# Patient Record
Sex: Female | Born: 1975
Health system: Southern US, Community
[De-identification: ages and names within clinical notes are randomized; demographics above are authoritative.]

## PROBLEM LIST (undated history)

## (undated) DIAGNOSIS — M722 Plantar fascial fibromatosis: Secondary | ICD-10-CM

## (undated) DIAGNOSIS — F418 Other specified anxiety disorders: Secondary | ICD-10-CM

## (undated) DIAGNOSIS — K219 Gastro-esophageal reflux disease without esophagitis: Secondary | ICD-10-CM

## (undated) DIAGNOSIS — M779 Enthesopathy, unspecified: Secondary | ICD-10-CM

## (undated) DIAGNOSIS — G43909 Migraine, unspecified, not intractable, without status migrainosus: Secondary | ICD-10-CM

## (undated) HISTORY — DX: Migraine, unspecified, not intractable, without status migrainosus: G43.909

## (undated) HISTORY — DX: Enthesopathy, unspecified: M77.9

## (undated) HISTORY — PX: CHOLECYSTECTOMY: SHX55

## (undated) HISTORY — DX: Other specified anxiety disorders: F41.8

## (undated) HISTORY — PX: INCISION AND DRAINAGE PERIRECTAL ABSCESS: SHX1804

## (undated) HISTORY — DX: Plantar fascial fibromatosis: M72.2

## (undated) HISTORY — PX: TUBAL LIGATION: SHX77

## (undated) HISTORY — PX: LEG SURGERY: SHX1003

---

## 2007-01-01 ENCOUNTER — Other Ambulatory Visit: Admission: RE | Admit: 2007-01-01 | Discharge: 2007-01-01 | Payer: Self-pay | Admitting: Internal Medicine

## 2013-05-19 ENCOUNTER — Telehealth: Payer: Self-pay | Admitting: Family Medicine

## 2013-05-19 NOTE — Telephone Encounter (Signed)
Wanted appt for today for leg pain. No appt available. Will be a new pt. Will to to urgent care today.

## 2013-09-25 ENCOUNTER — Other Ambulatory Visit (HOSPITAL_COMMUNITY): Payer: Self-pay | Admitting: Family Medicine

## 2013-09-25 DIAGNOSIS — R29898 Other symptoms and signs involving the musculoskeletal system: Secondary | ICD-10-CM

## 2013-09-29 ENCOUNTER — Ambulatory Visit (HOSPITAL_COMMUNITY)
Admission: RE | Admit: 2013-09-29 | Discharge: 2013-09-29 | Disposition: A | Discharge status: Home / Self Care | Payer: BC Managed Care – PPO | Source: Ambulatory Visit | Attending: Family Medicine | Admitting: Family Medicine

## 2013-09-29 DIAGNOSIS — R29898 Other symptoms and signs involving the musculoskeletal system: Secondary | ICD-10-CM

## 2013-10-02 ENCOUNTER — Other Ambulatory Visit (HOSPITAL_COMMUNITY): Payer: Self-pay | Admitting: Family Medicine

## 2013-10-02 DIAGNOSIS — R229 Localized swelling, mass and lump, unspecified: Secondary | ICD-10-CM

## 2013-10-06 ENCOUNTER — Other Ambulatory Visit (HOSPITAL_COMMUNITY): Payer: Self-pay | Admitting: Family Medicine

## 2013-10-06 ENCOUNTER — Ambulatory Visit (HOSPITAL_COMMUNITY)
Admission: RE | Admit: 2013-10-06 | Discharge: 2013-10-06 | Disposition: A | Discharge status: Home / Self Care | Payer: BC Managed Care – PPO | Source: Ambulatory Visit | Attending: Family Medicine | Admitting: Family Medicine

## 2013-10-06 DIAGNOSIS — M949 Disorder of cartilage, unspecified: Principal | ICD-10-CM

## 2013-10-06 DIAGNOSIS — R229 Localized swelling, mass and lump, unspecified: Secondary | ICD-10-CM

## 2013-10-06 DIAGNOSIS — M899 Disorder of bone, unspecified: Secondary | ICD-10-CM | POA: Insufficient documentation

## 2013-10-10 ENCOUNTER — Other Ambulatory Visit (HOSPITAL_COMMUNITY): Payer: Self-pay | Admitting: Family Medicine

## 2013-10-10 DIAGNOSIS — R229 Localized swelling, mass and lump, unspecified: Secondary | ICD-10-CM

## 2013-10-14 ENCOUNTER — Ambulatory Visit (HOSPITAL_COMMUNITY)
Admission: RE | Admit: 2013-10-14 | Discharge: 2013-10-14 | Disposition: A | Discharge status: Home / Self Care | Payer: BC Managed Care – PPO | Source: Ambulatory Visit | Attending: Family Medicine | Admitting: Family Medicine

## 2013-10-14 DIAGNOSIS — R229 Localized swelling, mass and lump, unspecified: Secondary | ICD-10-CM

## 2013-10-14 DIAGNOSIS — D2120 Benign neoplasm of connective and other soft tissue of unspecified lower limb, including hip: Secondary | ICD-10-CM | POA: Diagnosis not present

## 2017-07-27 ENCOUNTER — Ambulatory Visit (HOSPITAL_COMMUNITY)
Admission: RE | Admit: 2017-07-27 | Discharge: 2017-07-27 | Disposition: A | Discharge status: Home / Self Care | Payer: 59 | Source: Ambulatory Visit | Attending: Family Medicine | Admitting: Family Medicine

## 2017-07-27 ENCOUNTER — Other Ambulatory Visit (HOSPITAL_COMMUNITY): Payer: Self-pay | Admitting: Family Medicine

## 2017-07-27 DIAGNOSIS — M79674 Pain in right toe(s): Secondary | ICD-10-CM

## 2017-07-27 DIAGNOSIS — E669 Obesity, unspecified: Secondary | ICD-10-CM | POA: Insufficient documentation

## 2017-07-27 DIAGNOSIS — M19071 Primary osteoarthritis, right ankle and foot: Secondary | ICD-10-CM | POA: Diagnosis not present

## 2017-07-27 DIAGNOSIS — M19072 Primary osteoarthritis, left ankle and foot: Secondary | ICD-10-CM | POA: Diagnosis not present

## 2017-07-27 DIAGNOSIS — M79675 Pain in left toe(s): Secondary | ICD-10-CM

## 2017-07-27 DIAGNOSIS — M21612 Bunion of left foot: Secondary | ICD-10-CM | POA: Insufficient documentation

## 2018-05-13 DIAGNOSIS — J019 Acute sinusitis, unspecified: Secondary | ICD-10-CM | POA: Diagnosis not present

## 2018-05-13 DIAGNOSIS — H6993 Unspecified Eustachian tube disorder, bilateral: Secondary | ICD-10-CM | POA: Diagnosis not present

## 2018-05-13 DIAGNOSIS — Z1389 Encounter for screening for other disorder: Secondary | ICD-10-CM | POA: Diagnosis not present

## 2018-05-13 DIAGNOSIS — Z6831 Body mass index (BMI) 31.0-31.9, adult: Secondary | ICD-10-CM | POA: Diagnosis not present

## 2018-05-13 DIAGNOSIS — E6609 Other obesity due to excess calories: Secondary | ICD-10-CM | POA: Diagnosis not present

## 2018-05-23 DIAGNOSIS — F419 Anxiety disorder, unspecified: Secondary | ICD-10-CM | POA: Diagnosis not present

## 2018-11-28 ENCOUNTER — Other Ambulatory Visit: Payer: Self-pay

## 2018-11-28 DIAGNOSIS — Z20828 Contact with and (suspected) exposure to other viral communicable diseases: Secondary | ICD-10-CM | POA: Diagnosis not present

## 2018-11-28 DIAGNOSIS — Z20822 Contact with and (suspected) exposure to covid-19: Secondary | ICD-10-CM

## 2018-11-30 LAB — NOVEL CORONAVIRUS, NAA: SARS-CoV-2, NAA: NOT DETECTED

## 2019-01-10 DIAGNOSIS — K219 Gastro-esophageal reflux disease without esophagitis: Secondary | ICD-10-CM | POA: Diagnosis not present

## 2019-01-10 DIAGNOSIS — E6609 Other obesity due to excess calories: Secondary | ICD-10-CM | POA: Diagnosis not present

## 2019-01-10 DIAGNOSIS — E7849 Other hyperlipidemia: Secondary | ICD-10-CM | POA: Diagnosis not present

## 2019-01-10 DIAGNOSIS — Z6830 Body mass index (BMI) 30.0-30.9, adult: Secondary | ICD-10-CM | POA: Diagnosis not present

## 2019-01-10 DIAGNOSIS — R39198 Other difficulties with micturition: Secondary | ICD-10-CM | POA: Diagnosis not present

## 2019-01-10 DIAGNOSIS — R1013 Epigastric pain: Secondary | ICD-10-CM | POA: Diagnosis not present

## 2019-01-10 DIAGNOSIS — M549 Dorsalgia, unspecified: Secondary | ICD-10-CM | POA: Diagnosis not present

## 2019-01-13 DIAGNOSIS — R1013 Epigastric pain: Secondary | ICD-10-CM | POA: Diagnosis not present

## 2019-01-24 DIAGNOSIS — R109 Unspecified abdominal pain: Secondary | ICD-10-CM | POA: Diagnosis not present

## 2019-01-29 ENCOUNTER — Encounter: Payer: Self-pay | Admitting: Gastroenterology

## 2019-02-25 ENCOUNTER — Encounter: Payer: Self-pay | Admitting: Gastroenterology

## 2019-02-25 ENCOUNTER — Other Ambulatory Visit: Payer: Self-pay

## 2019-02-25 ENCOUNTER — Ambulatory Visit: Payer: BC Managed Care – PPO | Admitting: Gastroenterology

## 2019-02-25 DIAGNOSIS — K760 Fatty (change of) liver, not elsewhere classified: Secondary | ICD-10-CM

## 2019-02-25 DIAGNOSIS — R1013 Epigastric pain: Secondary | ICD-10-CM

## 2019-02-25 NOTE — Patient Instructions (Addendum)
We have scheduled an upper endoscopy in the near future.  Continue with Protonix once each morning, 30 minutes before breakfast on an empty stomach as it is best absorbed this way.   I have attached a brief handout on fatty liver. Recommend yearly checks of liver numbers. Focus on good dietary habits and exercise as you have started to do!  It was a pleasure to see you today. I want to create trusting relationships with patients to provide genuine, compassionate, and quality care. I value your feedback. If you receive a survey regarding your visit,  I greatly appreciate you taking time to fill this out.   Annitta Needs, PhD, ANP-BC [facility]    Fatty Liver Disease  Fatty liver disease occurs when too much fat has built up in your liver cells. Fatty liver disease is also called hepatic steatosis or steatohepatitis. The liver removes harmful substances from your bloodstream and produces fluids that your body needs. It also helps your body use and store energy from the food you eat. In many cases, fatty liver disease does not cause symptoms or problems. It is often diagnosed when tests are being done for other reasons. However, over time, fatty liver can cause inflammation that may lead to more serious liver problems, such as scarring of the liver (cirrhosis) and liver failure. Fatty liver is associated with insulin resistance, increased body fat, high blood pressure (hypertension), and high cholesterol. These are features of metabolic syndrome and increase your risk for stroke, diabetes, and heart disease. What are the causes? This condition may be caused by:  Drinking too much alcohol.  Poor nutrition.  Obesity.  Cushing's syndrome.  Diabetes.  High cholesterol.  Certain drugs.  Poisons.  Some viral infections.  Pregnancy. What increases the risk? You are more likely to develop this condition if you:  Abuse alcohol.  Are overweight.  Have  diabetes.  Have hepatitis.  Have a high triglyceride level.  Are pregnant. What are the signs or symptoms? Fatty liver disease often does not cause symptoms. If symptoms do develop, they can include:  Fatigue.  Weakness.  Weight loss.  Confusion.  Abdominal pain.  Nausea and vomiting.  Yellowing of your skin and the white parts of your eyes (jaundice).  Itchy skin. How is this diagnosed? This condition may be diagnosed by:  A physical exam and medical history.  Blood tests.  Imaging tests, such as an ultrasound, CT scan, or MRI.  A liver biopsy. A small sample of liver tissue is removed using a needle. The sample is then looked at under a microscope. How is this treated? Fatty liver disease is often caused by other health conditions. Treatment for fatty liver may involve medicines and lifestyle changes to manage conditions such as:  Alcoholism.  High cholesterol.  Diabetes.  Being overweight or obese. Follow these instructions at home:   Do not drink alcohol. If you have trouble quitting, ask your health care provider how to safely quit with the help of medicine or a supervised program. This is important to keep your condition from getting worse.  Eat a healthy diet as told by your health care provider. Ask your health care provider about working with a diet and nutrition specialist (dietitian) to develop an eating plan.  Exercise regularly. This can help you lose weight and control your cholesterol and diabetes. Talk to your health care provider about an exercise plan and which activities are best for you.  Take over-the-counter and prescription medicines only  as told by your health care provider.  Keep all follow-up visits as told by your health care provider. This is important. Contact a health care provider if: You have trouble controlling your:  Blood sugar. This is especially important if you have diabetes.  Cholesterol.  Drinking of alcohol. Get  help right away if:  You have abdominal pain.  You have jaundice.  You have nausea and vomiting.  You vomit blood or material that looks like coffee grounds.  You have stools that are black, tar-like, or bloody. Summary  Fatty liver disease develops when too much fat builds up in the cells of your liver.  Fatty liver disease often causes no symptoms or problems. However, over time, fatty liver can cause inflammation that may lead to more serious liver problems, such as scarring of the liver (cirrhosis).  You are more likely to develop this condition if you abuse alcohol, are pregnant, are overweight, have diabetes, have hepatitis, or have high triglyceride levels.  Contact your health care provider if you have trouble controlling your weight, blood sugar, cholesterol, or drinking of alcohol. This information is not intended to replace advice given to you by your health care provider. Make sure you discuss any questions you have with your health care provider. Document Revised: 01/19/2017 Document Reviewed: 11/15/2016 Elsevier Patient Education  2020 [employer].

## 2019-02-25 NOTE — Progress Notes (Addendum)
REVIEWED-NO ADDITIONAL RECOMMENDATIONS.  Primary Care Physician:  Redmond School, MD Primary Gastroenterologist:  Dr. Oneida Alar   Chief Complaint  Patient presents with  . Abdominal Pain    2 months, comes/goes  . Nausea    all the time    HPI:   Veronica Jarvis is a 44 y.o. female presenting today at the request of Dr. Gerarda Fraction due to abdominal pain.   Labs from PCP received dated 01/28/2019: Hgb 13.4, HCt 38.5, Platelets 357, tbili 0.3, Alk Phos 53, AST 18, ALT 17. US abdomen Nov 2020 through [facility] (available through [place]) with fatty liver.    Upper abdominal pain starting in 12-15-2018. Father passed away in 12/15/2018 as well. Associated nausea. Sometimes food will exacerbate pain. Intermittent. Pain will occur several times per day, lasting 30 minutes to an hour at a time. Nothing relieves. Feels like a stabbing pain. No typical reflux symptoms. Had been taking OTC medications then was started on Protonix. Zofran helps relieve nausea.  No solid food dysphagia but feels like she gets strangled with liquids. No weight loss. Appetite is healthy. No lower GI concerns. No melena or hematochezia. Used to take Ibuprofen a lot until the past year and then placed on Meloxicam. Was then taken off meloxicam and placed on Celebrex.   Past Medical History:  Diagnosis Date  . Bone spur   . Depression with anxiety   . Migraines   . Plantar fasciitis     Past Surgical History:  Procedure Laterality Date  . CHOLECYSTECTOMY    . INCISION AND DRAINAGE PERIRECTAL ABSCESS     in 16s  . LEG SURGERY     bone shaved down on femur  . TUBAL LIGATION      Current Outpatient Medications  Medication Sig Dispense Refill  . ALPRAZolam (XANAX) 0.5 MG tablet Take 0.5 mg by mouth 3 (three) times daily.    . Ascorbic Acid (VITA-C PO) Take by mouth daily.    Marland Kitchen buPROPion (WELLBUTRIN XL) 300 MG 24 hr tablet Take 300 mg by mouth daily.    . celecoxib (CELEBREX) 200 MG capsule Take 200 mg by mouth 2 (two)  times daily.    Marland Kitchen CRANBERRY PO Take by mouth daily.    . fexofenadine (ALLEGRA) 180 MG tablet Take 180 mg by mouth daily.    . ondansetron (ZOFRAN) 4 MG tablet Take 4 mg by mouth every 6 (six) hours as needed.    . pantoprazole (PROTONIX) 40 MG tablet Take 40 mg by mouth daily.    . rizatriptan (MAXALT-MLT) 10 MG disintegrating tablet as needed.     No current facility-administered medications for this visit.    Allergies as of 02/25/2019  . (No Known Allergies)    Family History  Problem Relation Age of Onset  . Cirrhosis Mother   . Colon cancer Neg Hx   . Colon polyps Neg Hx     Social History   Socioeconomic History  . Marital status: Married    Spouse name: Not on file  . Number of children: Not on file  . Years of education: Not on file  . Highest education level: Not on file  Occupational History  . Not on file  Tobacco Use  . Smoking status: Current Every Day Smoker    Packs/day: 1.00    Types: Cigarettes  . Smokeless tobacco: Never Used  Substance and Sexual Activity  . Alcohol use: Yes    Comment: occas  . Drug use: Never  .  Sexual activity: Not on file  Other Topics Concern  . Not on file  Social History Narrative  . Not on file   Social Determinants of Health   Financial Resource Strain:   . Difficulty of Paying Living Expenses: Not on file  Food Insecurity:   . Worried About Charity fundraiser in the Last Year: Not on file  . Ran Out of Food in the Last Year: Not on file  Transportation Needs:   . Lack of Transportation (Medical): Not on file  . Lack of Transportation (Non-Medical): Not on file  Physical Activity:   . Days of Exercise per Week: Not on file  . Minutes of Exercise per Session: Not on file  Stress:   . Feeling of Stress : Not on file  Social Connections:   . Frequency of Communication with Friends and Family: Not on file  . Frequency of Social Gatherings with Friends and Family: Not on file  . Attends Religious Services: Not  on file  . Active Member of Clubs or Organizations: Not on file  . Attends Archivist Meetings: Not on file  . Marital Status: Not on file  Intimate Partner Violence:   . Fear of Current or Ex-Partner: Not on file  . Emotionally Abused: Not on file  . Physically Abused: Not on file  . Sexually Abused: Not on file    Review of Systems: Gen: Denies any fever, chills, fatigue, weight loss, lack of appetite.  CV: Denies chest pain, heart palpitations, peripheral edema, syncope.  Resp: Denies shortness of breath at rest or with exertion. Denies wheezing or cough.  GI: see HPI GU : Denies urinary burning, urinary frequency, urinary hesitancy MS: Denies joint pain, muscle weakness, cramps, or limitation of movement.  Derm: Denies rash, itching, dry skin Psych: Denies depression, anxiety, memory loss, and confusion Heme: Denies bruising, bleeding, and enlarged lymph nodes.  Physical Exam: BP 128/79   Pulse 77   Temp (!) 97.1 F (36.2 C) (Oral)   Ht '5\' 9"'$  (1.753 m)   Wt 183 lb 3.2 oz (83.1 kg)   BMI 27.05 kg/m  General:   Alert and oriented. Pleasant and cooperative. Well-nourished and well-developed.  Head:  Normocephalic and atraumatic. Eyes:  Without icterus, sclera clear and conjunctiva pink.  Lungs:  Clear to auscultation bilaterally. No wheezes, rales, or rhonchi. No distress.  Heart:  S1, S2 present without murmurs appreciated.  Abdomen:  +BS, soft, non-tender and non-distended. No HSM noted. No guarding or rebound. No masses appreciated.  Rectal:  Deferred  Msk:  Symmetrical without gross deformities. Normal posture. Extremities:  Without edema. Neurologic:  Alert and  oriented x4;  grossly normal neurologically. Skin:  Intact without significant lesions or rashes. Psych:  Alert and cooperative. Normal mood and affect.  ASSESSMENT: Veronica Jarvis is a 44 y.o. female presenting today with several month history of dyspepsia in setting of NSAIDs and no PPI, without  weight loss, denying solid food dysphagia but occasionally strangled with liquids, and without any overt GI bleeding. Labs reviewed from Dec 2020 without anemia. HFP normal and gallbladder absent. US abdomen unrevealing except for fatty liver.  Needs EGD in near future with Dr. Oneida Alar. Continue with Protonix daily. Recommend avoidance of NSAIDs.   Fatty liver: normal LFTs on file. Mother with cirrhosis due to fatty liver. Continue serial following of labs, diet/exercise modification. Handouts provided. Continue avoiding ETOH.    PLAN:  Proceed with upper endoscopy in the near future with Dr.  Fields. The risks, benefits, and alternatives have been discussed in detail with patient. They have stated understanding and desire to proceed.  Propofol due to polypharmacy  NSAID avoidance  Serial following of labs, fatty liver handout provided   Further recommendations to follow   Annitta Needs, PhD, ANP-BC [facility]

## 2019-02-26 ENCOUNTER — Telehealth: Payer: Self-pay

## 2019-02-26 ENCOUNTER — Encounter: Payer: Self-pay | Admitting: Gastroenterology

## 2019-02-26 NOTE — Telephone Encounter (Signed)
Called and informed pt of COVID test and Pre-op appts scheduled for 02/28/19.

## 2019-02-26 NOTE — Progress Notes (Signed)
Cc'ed to pcp °

## 2019-02-28 ENCOUNTER — Encounter (HOSPITAL_COMMUNITY)
Admission: RE | Admit: 2019-02-28 | Discharge: 2019-02-28 | Disposition: A | Discharge status: Home / Self Care | Payer: BC Managed Care – PPO | Source: Ambulatory Visit | Attending: Gastroenterology | Admitting: Gastroenterology

## 2019-02-28 ENCOUNTER — Encounter (HOSPITAL_COMMUNITY): Payer: Self-pay

## 2019-02-28 ENCOUNTER — Other Ambulatory Visit: Payer: Self-pay

## 2019-02-28 ENCOUNTER — Other Ambulatory Visit (HOSPITAL_COMMUNITY)
Admission: RE | Admit: 2019-02-28 | Discharge: 2019-02-28 | Disposition: A | Discharge status: Home / Self Care | Payer: BC Managed Care – PPO | Source: Ambulatory Visit | Attending: Gastroenterology | Admitting: Gastroenterology

## 2019-02-28 DIAGNOSIS — Z20822 Contact with and (suspected) exposure to covid-19: Secondary | ICD-10-CM | POA: Insufficient documentation

## 2019-02-28 DIAGNOSIS — Z01812 Encounter for preprocedural laboratory examination: Secondary | ICD-10-CM | POA: Diagnosis not present

## 2019-02-28 HISTORY — DX: Gastro-esophageal reflux disease without esophagitis: K21.9

## 2019-02-28 LAB — SARS CORONAVIRUS 2 (TAT 6-24 HRS): SARS Coronavirus 2: NEGATIVE

## 2019-03-04 ENCOUNTER — Encounter (HOSPITAL_COMMUNITY): Payer: Self-pay | Admitting: Gastroenterology

## 2019-03-04 ENCOUNTER — Ambulatory Visit (HOSPITAL_COMMUNITY)
Admission: RE | Admit: 2019-03-04 | Discharge: 2019-03-04 | Disposition: A | Discharge status: Home / Self Care | Payer: BC Managed Care – PPO | Attending: Gastroenterology | Admitting: Gastroenterology

## 2019-03-04 ENCOUNTER — Ambulatory Visit (HOSPITAL_COMMUNITY): Payer: BC Managed Care – PPO | Admitting: Anesthesiology

## 2019-03-04 ENCOUNTER — Encounter (HOSPITAL_COMMUNITY)
Admission: RE | Disposition: A | Discharge status: Home / Self Care | Payer: Self-pay | Source: Home / Self Care | Attending: Gastroenterology

## 2019-03-04 DIAGNOSIS — G43909 Migraine, unspecified, not intractable, without status migrainosus: Secondary | ICD-10-CM | POA: Insufficient documentation

## 2019-03-04 DIAGNOSIS — Z79899 Other long term (current) drug therapy: Secondary | ICD-10-CM | POA: Diagnosis not present

## 2019-03-04 DIAGNOSIS — Z791 Long term (current) use of non-steroidal anti-inflammatories (NSAID): Secondary | ICD-10-CM | POA: Insufficient documentation

## 2019-03-04 DIAGNOSIS — K2950 Unspecified chronic gastritis without bleeding: Secondary | ICD-10-CM | POA: Insufficient documentation

## 2019-03-04 DIAGNOSIS — K2970 Gastritis, unspecified, without bleeding: Secondary | ICD-10-CM

## 2019-03-04 DIAGNOSIS — R1013 Epigastric pain: Secondary | ICD-10-CM | POA: Insufficient documentation

## 2019-03-04 DIAGNOSIS — F418 Other specified anxiety disorders: Secondary | ICD-10-CM | POA: Insufficient documentation

## 2019-03-04 DIAGNOSIS — F17210 Nicotine dependence, cigarettes, uncomplicated: Secondary | ICD-10-CM | POA: Insufficient documentation

## 2019-03-04 DIAGNOSIS — K3189 Other diseases of stomach and duodenum: Secondary | ICD-10-CM | POA: Diagnosis not present

## 2019-03-04 DIAGNOSIS — K319 Disease of stomach and duodenum, unspecified: Secondary | ICD-10-CM | POA: Diagnosis not present

## 2019-03-04 DIAGNOSIS — K219 Gastro-esophageal reflux disease without esophagitis: Secondary | ICD-10-CM | POA: Diagnosis not present

## 2019-03-04 HISTORY — PX: ESOPHAGOGASTRODUODENOSCOPY (EGD) WITH PROPOFOL: SHX5813

## 2019-03-04 HISTORY — PX: BIOPSY: SHX5522

## 2019-03-04 SURGERY — ESOPHAGOGASTRODUODENOSCOPY (EGD) WITH PROPOFOL
Anesthesia: General

## 2019-03-04 MED ORDER — GLYCOPYRROLATE 0.2 MG/ML IJ SOLN
INTRAMUSCULAR | Status: DC | PRN
  Administered 2019-03-04: .2 mg via INTRAVENOUS

## 2019-03-04 MED ORDER — LACTATED RINGERS IV SOLN: INTRAVENOUS | Status: DC

## 2019-03-04 MED ORDER — PROPOFOL 10 MG/ML IV BOLUS
INTRAVENOUS | Status: DC | PRN
  Administered 2019-03-04: 20 mg via INTRAVENOUS

## 2019-03-04 MED ORDER — LIDOCAINE VISCOUS HCL 2 % MT SOLN: 5.0000 mL | Freq: Once | OROMUCOSAL | Status: DC

## 2019-03-04 MED ORDER — KETAMINE HCL 10 MG/ML IJ SOLN
INTRAMUSCULAR | Status: DC | PRN
  Administered 2019-03-04: 20 mg via INTRAVENOUS

## 2019-03-04 MED ORDER — LIDOCAINE HCL (CARDIAC) PF 100 MG/5ML IV SOSY
PREFILLED_SYRINGE | INTRAVENOUS | Status: DC | PRN
  Administered 2019-03-04: 60 mg via INTRAVENOUS

## 2019-03-04 MED ORDER — PROPOFOL 500 MG/50ML IV EMUL
INTRAVENOUS | Status: DC | PRN
  Administered 2019-03-04: 175 ug/kg/min via INTRAVENOUS

## 2019-03-04 NOTE — Anesthesia Postprocedure Evaluation (Signed)
Anesthesia Post Note  Patient: Veronica Jarvis  Procedure(s) Performed: ESOPHAGOGASTRODUODENOSCOPY (EGD) WITH PROPOFOL (N/A ) BIOPSY  Patient location during evaluation: PACU Anesthesia Type: General Level of consciousness: awake and alert, oriented and patient cooperative Pain management: pain level controlled Vital Signs Assessment: post-procedure vital signs reviewed and stable Respiratory status: spontaneous breathing Postop Assessment: no apparent nausea or vomiting Anesthetic complications: no     Last Vitals:  Vitals:   03/04/19 0733 03/04/19 0904  BP: 116/68   Pulse: 80   Resp: 18   Temp: 36.7 C (P) 36.6 C  SpO2: 99%     Last Pain:  Vitals:   03/04/19 0846  TempSrc:   PainSc: 0-No pain                 Dierdra Salameh A

## 2019-03-04 NOTE — H&P (Signed)
Primary Care Physician:  Scherrie Bateman Primary Gastroenterologist:  Dr. Oneida Alar  Pre-Procedure History & Physical: HPI:  Veronica Jarvis is a 44 y.o. female here for DYSPEPSIA.  Past Medical History:  Diagnosis Date  . Bone spur   . Depression with anxiety   . GERD (gastroesophageal reflux disease)   . Migraines   . Plantar fasciitis     Past Surgical History:  Procedure Laterality Date  . CHOLECYSTECTOMY    . INCISION AND DRAINAGE PERIRECTAL ABSCESS     in 64s  . LEG SURGERY     bone shaved down on femur  . TUBAL LIGATION      Prior to Admission medications   Medication Sig Start Date End Date Taking? Authorizing Provider  ALPRAZolam Duanne Moron) 0.5 MG tablet Take 0.5 mg by mouth 3 (three) times daily as needed for anxiety.  01/22/19  Yes [provider]  Ascorbic Acid (VITAMIN C) 1000 MG tablet Take 2,000 mg by mouth daily.   Yes [provider]  buPROPion (WELLBUTRIN XL) 300 MG 24 hr tablet Take 300 mg by mouth daily.   Yes [provider]  celecoxib (CELEBREX) 200 MG capsule Take 200 mg by mouth 2 (two) times daily. 02/11/19  Yes [provider]  CRANBERRY PO Take 2 capsules by mouth daily.    Yes [provider]  Cyanocobalamin (VITAMIN B-12) 5000 MCG SUBL Take 5,000 mcg by mouth daily.   Yes [provider]  fexofenadine (ALLEGRA) 180 MG tablet Take 180 mg by mouth daily.   Yes [provider]  ondansetron (ZOFRAN) 4 MG tablet Take 4 mg by mouth every 6 (six) hours as needed for nausea.  01/10/19  Yes [provider]  pantoprazole (PROTONIX) 40 MG tablet Take 40 mg by mouth daily. 02/11/19  Yes [provider]  rizatriptan (MAXALT-MLT) 10 MG disintegrating tablet Take 10 mg by mouth every 2 (two) hours as needed for migraine.  01/10/19  Yes [provider]    Allergies as of 02/25/2019  . (No Known Allergies)    Family History  Problem Relation Age of Onset  . Cirrhosis  Mother   . Colon cancer Neg Hx   . Colon polyps Neg Hx     Social History   Socioeconomic History  . Marital status: Married    Spouse name: Not on file  . Number of children: Not on file  . Years of education: Not on file  . Highest education level: Not on file  Occupational History  . Not on file  Tobacco Use  . Smoking status: Current Every Day Smoker    Packs/day: 1.00    Years: 15.00    Pack years: 15.00    Types: Cigarettes  . Smokeless tobacco: Never Used  Substance and Sexual Activity  . Alcohol use: Yes    Comment: occas  . Drug use: Never  . Sexual activity: Not on file  Other Topics Concern  . Not on file  Social History Narrative  . Not on file   Social Determinants of Health   Financial Resource Strain:   . Difficulty of Paying Living Expenses: Not on file  Food Insecurity:   . Worried About Charity fundraiser in the Last Year: Not on file  . Ran Out of Food in the Last Year: Not on file  Transportation Needs:   . Lack of Transportation (Medical): Not on file  . Lack of Transportation (Non-Medical): Not on file  Physical Activity:   .  Days of Exercise per Week: Not on file  . Minutes of Exercise per Session: Not on file  Stress:   . Feeling of Stress : Not on file  Social Connections:   . Frequency of Communication with Friends and Family: Not on file  . Frequency of Social Gatherings with Friends and Family: Not on file  . Attends Religious Services: Not on file  . Active Member of Clubs or Organizations: Not on file  . Attends Archivist Meetings: Not on file  . Marital Status: Not on file  Intimate Partner Violence:   . Fear of Current or Ex-Partner: Not on file  . Emotionally Abused: Not on file  . Physically Abused: Not on file  . Sexually Abused: Not on file    Review of Systems: See HPI, otherwise negative ROS   Physical Exam: BP 116/68   Pulse 80   Temp 98 F (36.7 C) (Oral)   Resp 18   SpO2 99%  General:   Alert,   pleasant and cooperative in NAD Head:  Normocephalic and atraumatic. Neck:  Supple; Lungs:  Clear throughout to auscultation.    Heart:  Regular rate and rhythm. Abdomen:  Soft, nontender and nondistended. Normal bowel sounds, without guarding, and without rebound.   Neurologic:  Alert and  oriented x4;  grossly normal neurologically.  Impression/Plan:    DYSPEPSIA  PLAN:  EGD TODAY.  DISCUSSED PROCEDURE, BENEFITS, & RISKS: < 1% chance of medication reaction, bleeding, perforation, or ASPIRATION.

## 2019-03-04 NOTE — Anesthesia Procedure Notes (Addendum)
Procedure Name: General with mask airway Date/Time: 03/04/2019 8:44 AM Performed by: Andree Elk, Jeno Calleros A, CRNA Pre-anesthesia Checklist: Timeout performed, Patient being monitored, Suction available, Emergency Drugs available and Patient identified Oxygen Delivery Method: Non-rebreather mask

## 2019-03-04 NOTE — Op Note (Signed)
[facility] Patient Name: Veronica Jarvis Procedure Date: 03/04/2019 8:22 AM MRN: [id number] Date of Birth: 01-10-1976 Attending MD: Barney Drain MD, MD CSN: [id number] Age: 44 Admit Type: Outpatient Procedure:                Upper GI endoscopy WITH COLD FORCEPS BIOPSY Indications:              Epigastric abdominal pain Providers:                Barney Drain MD, MD, Otis Peak B. Sharon Seller, RN, Nelma Rothman, Technician Referring MD:             Jake Samples PA Medicines:                Propofol per Anesthesia Complications:            No immediate complications. Estimated Blood Loss:     Estimated blood loss was minimal. Procedure:                Pre-Anesthesia Assessment:                           - Prior to the procedure, a History and Physical                            was performed, and patient medications and                            allergies were reviewed. The patient's tolerance of                            previous anesthesia was also reviewed. The risks                            and benefits of the procedure and the sedation                            options and risks were discussed with the patient.                            All questions were answered, and informed consent                            was obtained. Prior Anticoagulants: The patient has                            taken no previous anticoagulant or antiplatelet                            agents except for NSAID medication. ASA Grade                            Assessment: II - A patient with mild systemic  disease. After reviewing the risks and benefits,                            the patient was deemed in satisfactory condition to                            undergo the procedure. After obtaining informed                            consent, the endoscope was passed under direct                            vision. Throughout the procedure, the patient's                             blood pressure, pulse, and oxygen saturations were                            monitored continuously. The [id number] ID:[id number])                            scope was introduced through the mouth, and                            advanced to the duodenal bulb. The upper GI                            endoscopy was accomplished without difficulty. The                            patient tolerated the procedure well. Scope In: 8:53:14 AM Scope Out: 9:00:59 AM Total Procedure Duration: 0 hours 7 minutes 45 seconds  Findings:      The examined esophagus was normal.      Localized mild inflammation characterized by congestion (edema) and       erythema was found on the greater curvature of the stomach and in the       gastric antrum. Biopsies were taken with a cold forceps for Helicobacter       pylori testing.      The examined duodenum was normal. Biopsies were taken with a cold       forceps for histology. Impression:               - DYSPEPSIA MOST LIKELY DUE TO NSAID Gastritis.                            Biopsied. Moderate Sedation:      Per Anesthesia Care Recommendation:           - Patient has a contact number available for                            emergencies. The signs and symptoms of potential                            delayed complications were discussed with  the                            patient. Return to normal activities tomorrow.                            Written discharge instructions were provided to the                            patient.                           - Low fat diet. CONSIDER A DETOX DIET. USE TUMERIC                            OR ALPHA LIPOIC ACID AS AN ALTERNATIVE TO NSAIDs.                           - Continue present medications. PROTONIX DAILY. DO                            NOT TAKE ASA PRODUCTS WITH CELEBREX.                           - Await pathology results. CONSIDER CT ABD IF NO H                            PYLORI PRESENT.                            - Return to GI clinic in 4 months. Procedure Code(s):        --- Professional ---                           [phone], Esophagogastroduodenoscopy, flexible,                            transoral; with biopsy, single or multiple Diagnosis Code(s):        --- Professional ---                           K29.70, Gastritis, unspecified, without bleeding                           R10.13, Epigastric pain CPT copyright 2019 American Medical Association. All rights reserved. The codes documented in this report are preliminary and upon coder review may  be revised to meet current compliance requirements. Barney Drain, MD Barney Drain MD, MD 03/04/2019 9:18:54 AM This report has been signed electronically. Number of Addenda: 0

## 2019-03-04 NOTE — Transfer of Care (Signed)
Immediate Anesthesia Transfer of Care Note  Patient: Veronica Jarvis  Procedure(s) Performed: ESOPHAGOGASTRODUODENOSCOPY (EGD) WITH PROPOFOL (N/A ) BIOPSY  Patient Location: PACU  Anesthesia Type:General  Level of Consciousness: awake, oriented and patient cooperative  Airway & Oxygen Therapy: Patient Spontanous Breathing  Post-op Assessment: Report given to RN and Post -op Vital signs reviewed and stable  Post vital signs: Reviewed and stable  Last Vitals:  Vitals Value Taken Time  BP 107/60 03/04/19 0904  Temp    Pulse 96 03/04/19 0907  Resp 20 03/04/19 0907  SpO2 99 % 03/04/19 0907  Vitals shown include unvalidated device data.  Last Pain:  Vitals:   03/04/19 0846  TempSrc:   PainSc: 0-No pain         Complications: No apparent anesthesia complications

## 2019-03-04 NOTE — Discharge Instructions (Signed)
Your upper abdominal pain is most likely due to gastritis due to IBUPROFEN AND MELOXICAM. YOUR SMALL BOWEL LOOKED NORMAL. I biopsied your stomach and small bowel.    DRINK WATER TO KEEP YOUR URINE LIGHT YELLOW.  TO BETTER CONTROL MIGRAINES, CONSIDER CHANGING YOUR DIET. I RECOMMEND YOU READ AND FOLLOW RECOMMENDATIONS BY DR. MARK HYMAN, "10-DAY DETOX DIET".   TO REDUCE PAIN, CONSIDER TURMERIC OR ALPHA LIPOIC ACID DAILY AS AN ALTERNATIVE TO CELEBREX.  CONTINUE PROTONIX.  TAKE 30 MINUTES PRIOR TO YOUR FIRST MEAL EVERY DAY.   YOUR BIOPSY RESULTS WILL BE BACK IN 5 BUSINESS DAYS. IF YOU DO NOT HAVE H PYLORI STOMACH INFECTION, YOU WILL NEED A CT SCAN TO COMPLETELY EVALUATE YOUR PANCREAS.  FOLLOW UP IN 4 MOS.  UPPER ENDOSCOPY AFTER CARE Read the instructions outlined below and refer to this sheet in the next week. These discharge instructions provide you with general information on caring for yourself after you leave the hospital. While your treatment has been planned according to the most current medical practices available, unavoidable complications occasionally occur. If you have any problems or questions after discharge, call DR. Lorimer Tiberio, [phone].  ACTIVITY  You may resume your regular activity, but move at a slower pace for the next 24 hours.   Take frequent rest periods for the next 24 hours.   Walking will help get rid of the air and reduce the bloated feeling in your belly (abdomen).   No driving for 24 hours (because of the medicine (anesthesia) used during the test).   You may shower.   Do not sign any important legal documents or operate any machinery for 24 hours (because of the anesthesia used during the test).    NUTRITION  Drink plenty of fluids.   You may resume your normal diet as instructed by your doctor.   Begin with a light meal and progress to your normal diet. Heavy or fried foods are harder to digest and may make you feel sick to your stomach (nauseated).    Avoid alcoholic beverages for 24 hours or as instructed.    MEDICATIONS  You may resume your normal medications.   WHAT YOU CAN EXPECT TODAY  Some feelings of bloating in the abdomen.   Passage of more gas than usual.    IF YOU HAD A BIOPSY TAKEN DURING THE UPPER ENDOSCOPY:  Eat a soft diet IF YOU HAVE NAUSEA, BLOATING, ABDOMINAL PAIN, OR VOMITING.    FINDING OUT THE RESULTS OF YOUR TEST Not all test results are available during your visit. DR. Oneida Alar WILL CALL YOU WITHIN 14 DAYS OF YOUR PROCEDUE WITH YOUR RESULTS. Do not assume everything is normal if you have not heard from DR. Cara Aguino, CALL HER OFFICE AT [phone].  SEEK IMMEDIATE MEDICAL ATTENTION AND CALL THE OFFICE: [phone] IF:  You have more than a spotting of blood in your stool.   Your belly is swollen (abdominal distention).   You are nauseated or vomiting.   You have a temperature over 101F.   You have abdominal pain or discomfort that is severe or gets worse throughout the day.   Gastritis  Gastritis is an inflammation (the body's way of reacting to injury and/or infection) of the stomach. It is often caused by viral or bacterial (germ) infections. It can also be caused BY ASPIRIN, BC/GOODY POWDER'S, (IBUPROFEN) MOTRIN, OR ALEVE (NAPROXEN), chemicals (including alcohol), SPICY FOODS, and medications. This illness may be associated with generalized malaise (feeling tired, not well), UPPER ABDOMINAL STOMACH cramps,  and fever. One common bacterial cause of gastritis is an organism known as H. Pylori. This can be treated with antibiotics.

## 2019-03-04 NOTE — Anesthesia Preprocedure Evaluation (Signed)
Anesthesia Evaluation  Patient identified by MRN, date of birth, ID band Patient awake    Reviewed: Allergy & Precautions, NPO status , Patient's Chart, lab work & pertinent test results  Airway Mallampati: II  TM Distance: >3 FB Neck ROM: Full    Dental no notable dental hx. (+) Teeth Intact   Pulmonary neg pulmonary ROS, Current Smoker and Patient abstained from smoking.,    Pulmonary exam normal breath sounds clear to auscultation       Cardiovascular Exercise Tolerance: Good negative cardio ROS Normal cardiovascular examI Rhythm:Regular Rate:Normal     Neuro/Psych  Headaches, Anxiety Depression negative psych ROS   GI/Hepatic Neg liver ROS, GERD  Controlled and Medicated,Here for EGD   Endo/Other  negative endocrine ROS  Renal/GU negative Renal ROS  negative genitourinary   Musculoskeletal negative musculoskeletal ROS (+)   Abdominal   Peds negative pediatric ROS (+)  Hematology negative hematology ROS (+)   Anesthesia Other Findings   Reproductive/Obstetrics negative OB ROS                             Anesthesia Physical Anesthesia Plan  ASA: II  Anesthesia Plan: General   Post-op Pain Management:    Induction: Intravenous  PONV Risk Score and Plan: Propofol infusion, TIVA and Treatment may vary due to age or medical condition  Airway Management Planned: Simple Face Mask and Nasal Cannula  Additional Equipment:   Intra-op Plan:   Post-operative Plan:   Informed Consent: I have reviewed the patients History and Physical, chart, labs and discussed the procedure including the risks, benefits and alternatives for the proposed anesthesia with the patient or authorized representative who has indicated his/her understanding and acceptance.     Dental advisory given  Plan Discussed with: CRNA  Anesthesia Plan Comments: (Plan Full PPE use  Plan GA with GETA as needed d/w  pt -WTP with same after Q&A)        Anesthesia Quick Evaluation

## 2019-03-05 ENCOUNTER — Other Ambulatory Visit: Payer: Self-pay

## 2019-03-05 LAB — SURGICAL PATHOLOGY

## 2019-03-07 ENCOUNTER — Other Ambulatory Visit: Payer: Self-pay | Admitting: *Deleted

## 2019-03-07 ENCOUNTER — Telehealth: Payer: Self-pay | Admitting: *Deleted

## 2019-03-07 DIAGNOSIS — R109 Unspecified abdominal pain: Secondary | ICD-10-CM

## 2019-03-07 NOTE — Telephone Encounter (Signed)
PA for CT abd w w/o has been approved via [employer] website. Auth# [id number] dates 03/07/2019-09/02/2019

## 2019-03-11 NOTE — Progress Notes (Signed)
PATIENT SCHEDULED  °

## 2019-03-15 DIAGNOSIS — H04123 Dry eye syndrome of bilateral lacrimal glands: Secondary | ICD-10-CM | POA: Diagnosis not present

## 2019-03-15 DIAGNOSIS — H40033 Anatomical narrow angle, bilateral: Secondary | ICD-10-CM | POA: Diagnosis not present

## 2019-03-21 ENCOUNTER — Ambulatory Visit (HOSPITAL_COMMUNITY)
Admission: RE | Admit: 2019-03-21 | Discharge: 2019-03-21 | Disposition: A | Discharge status: Home / Self Care | Payer: BC Managed Care – PPO | Source: Ambulatory Visit | Attending: Gastroenterology | Admitting: Gastroenterology

## 2019-03-21 ENCOUNTER — Other Ambulatory Visit: Payer: Self-pay

## 2019-03-21 DIAGNOSIS — N200 Calculus of kidney: Secondary | ICD-10-CM | POA: Diagnosis not present

## 2019-03-21 DIAGNOSIS — R109 Unspecified abdominal pain: Secondary | ICD-10-CM | POA: Insufficient documentation

## 2019-03-21 MED ORDER — IOHEXOL 300 MG/ML  SOLN
100.0000 mL | Freq: Once | INTRAMUSCULAR | Status: AC | PRN
  Administered 2019-03-21: 100 mL via INTRAVENOUS

## 2019-03-25 NOTE — Progress Notes (Signed)
Patient scheduled.

## 2019-06-19 DIAGNOSIS — L821 Other seborrheic keratosis: Secondary | ICD-10-CM | POA: Diagnosis not present

## 2019-06-19 DIAGNOSIS — L209 Atopic dermatitis, unspecified: Secondary | ICD-10-CM | POA: Diagnosis not present

## 2019-06-19 DIAGNOSIS — Z681 Body mass index (BMI) 19 or less, adult: Secondary | ICD-10-CM | POA: Diagnosis not present

## 2019-06-19 DIAGNOSIS — Z1389 Encounter for screening for other disorder: Secondary | ICD-10-CM | POA: Diagnosis not present

## 2019-06-19 DIAGNOSIS — D485 Neoplasm of uncertain behavior of skin: Secondary | ICD-10-CM | POA: Diagnosis not present

## 2019-07-02 ENCOUNTER — Ambulatory Visit: Payer: BC Managed Care – PPO | Admitting: Gastroenterology

## 2019-08-01 DIAGNOSIS — Z6830 Body mass index (BMI) 30.0-30.9, adult: Secondary | ICD-10-CM | POA: Diagnosis not present

## 2019-08-01 DIAGNOSIS — E6609 Other obesity due to excess calories: Secondary | ICD-10-CM | POA: Diagnosis not present

## 2019-08-01 DIAGNOSIS — L209 Atopic dermatitis, unspecified: Secondary | ICD-10-CM | POA: Diagnosis not present

## 2019-08-01 DIAGNOSIS — G43709 Chronic migraine without aura, not intractable, without status migrainosus: Secondary | ICD-10-CM | POA: Diagnosis not present

## 2019-08-21 DIAGNOSIS — B359 Dermatophytosis, unspecified: Secondary | ICD-10-CM | POA: Diagnosis not present

## 2019-08-21 DIAGNOSIS — B355 Tinea imbricata: Secondary | ICD-10-CM | POA: Diagnosis not present

## 2019-12-15 ENCOUNTER — Other Ambulatory Visit: Payer: BC Managed Care – PPO

## 2019-12-15 DIAGNOSIS — Z20822 Contact with and (suspected) exposure to covid-19: Secondary | ICD-10-CM | POA: Diagnosis not present

## 2019-12-16 ENCOUNTER — Other Ambulatory Visit: Payer: BC Managed Care – PPO

## 2019-12-16 DIAGNOSIS — J329 Chronic sinusitis, unspecified: Secondary | ICD-10-CM | POA: Diagnosis not present

## 2019-12-16 DIAGNOSIS — U071 COVID-19: Secondary | ICD-10-CM | POA: Diagnosis not present

## 2019-12-16 DIAGNOSIS — G43909 Migraine, unspecified, not intractable, without status migrainosus: Secondary | ICD-10-CM | POA: Diagnosis not present

## 2019-12-16 LAB — NOVEL CORONAVIRUS, NAA: SARS-CoV-2, NAA: DETECTED — AB

## 2019-12-16 LAB — SARS-COV-2, NAA 2 DAY TAT

## 2019-12-17 ENCOUNTER — Telehealth: Payer: Self-pay | Admitting: Physician Assistant

## 2019-12-17 NOTE — Telephone Encounter (Signed)
Called to Discuss with patient about Covid symptoms and the use of the monoclonal antibody infusion for those with mild to moderate Covid symptoms and at a high risk of hospitalization.     Pt appears to qualify for this infusion due to co-morbid conditions and/or a member of an at-risk group in accordance with the FDA Emergency Use Authorization.    She qualifies with SVI and BMI. Positive test 12/15/19.  Unable to reach pt - I left a VM and sent a MyChart message.

## 2020-01-10 DIAGNOSIS — H04123 Dry eye syndrome of bilateral lacrimal glands: Secondary | ICD-10-CM | POA: Diagnosis not present

## 2020-01-10 DIAGNOSIS — H40033 Anatomical narrow angle, bilateral: Secondary | ICD-10-CM | POA: Diagnosis not present

## 2020-01-28 DIAGNOSIS — Z6831 Body mass index (BMI) 31.0-31.9, adult: Secondary | ICD-10-CM | POA: Diagnosis not present

## 2020-01-28 DIAGNOSIS — J329 Chronic sinusitis, unspecified: Secondary | ICD-10-CM | POA: Diagnosis not present

## 2020-01-28 DIAGNOSIS — J45909 Unspecified asthma, uncomplicated: Secondary | ICD-10-CM | POA: Diagnosis not present

## 2020-01-28 DIAGNOSIS — F419 Anxiety disorder, unspecified: Secondary | ICD-10-CM | POA: Diagnosis not present

## 2020-03-12 DIAGNOSIS — F419 Anxiety disorder, unspecified: Secondary | ICD-10-CM | POA: Diagnosis not present

## 2020-03-12 DIAGNOSIS — M546 Pain in thoracic spine: Secondary | ICD-10-CM | POA: Diagnosis not present

## 2020-03-12 DIAGNOSIS — G43709 Chronic migraine without aura, not intractable, without status migrainosus: Secondary | ICD-10-CM | POA: Diagnosis not present

## 2020-03-12 DIAGNOSIS — Z6832 Body mass index (BMI) 32.0-32.9, adult: Secondary | ICD-10-CM | POA: Diagnosis not present

## 2020-03-22 IMAGING — DX DG FOOT COMPLETE 3+V*L*
3 series · 3 of 3 positions shown · non-contrast
Comparison: None.

CLINICAL DATA: Left great toe pain.

EXAM:
LEFT FOOT - COMPLETE 3+ VIEW

[foot ap]
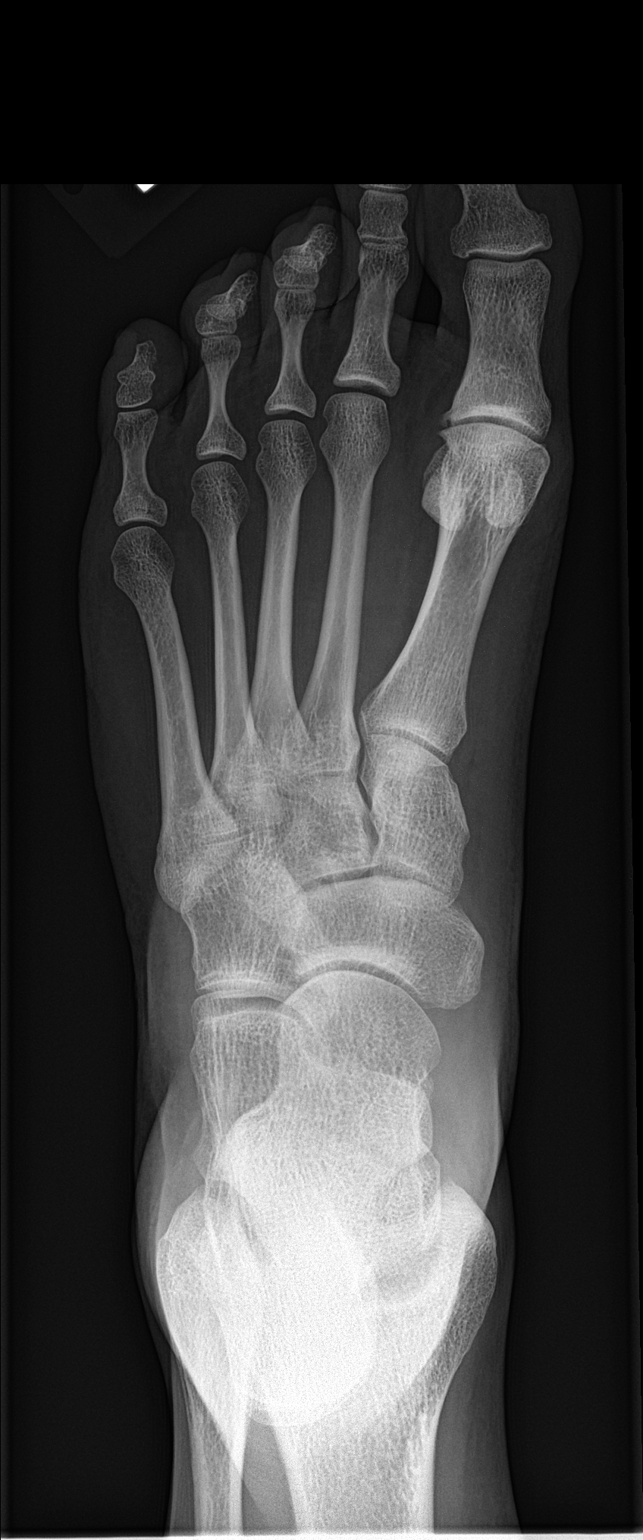

[foot obl]
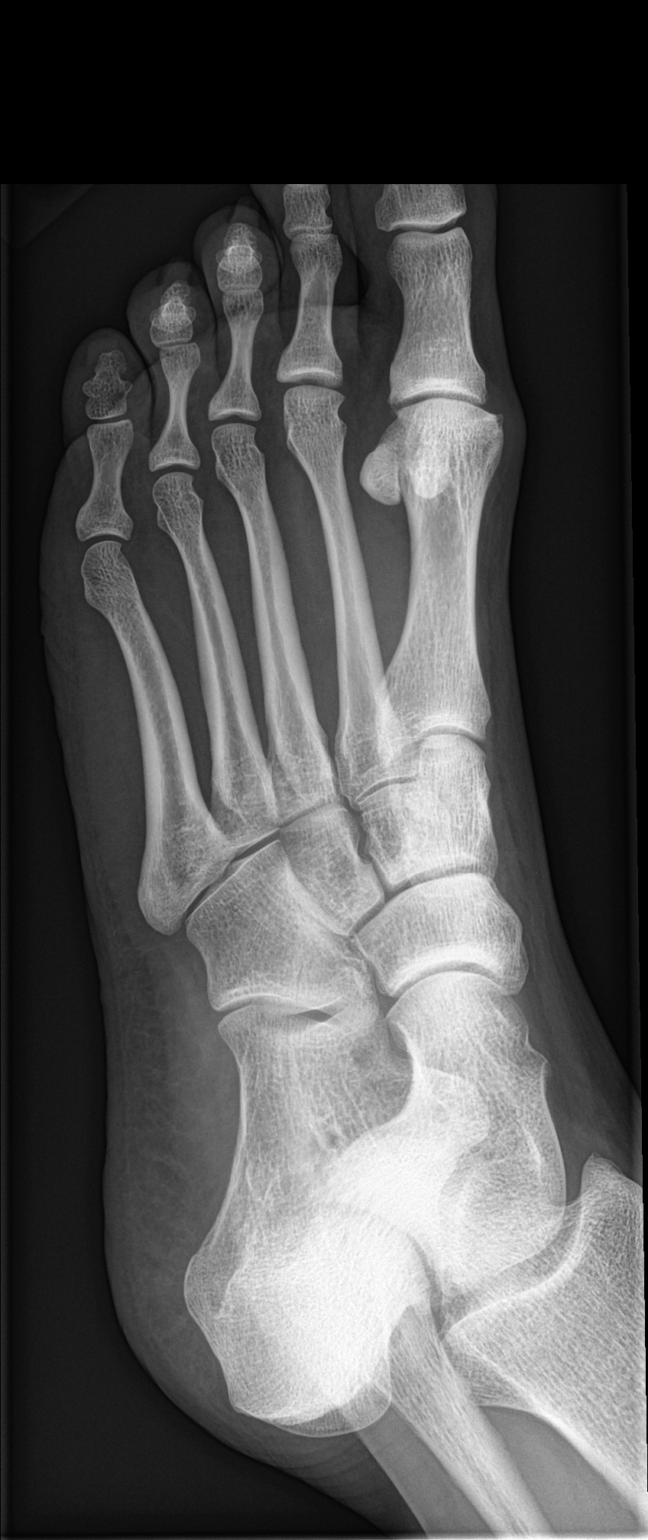

[foot lat]
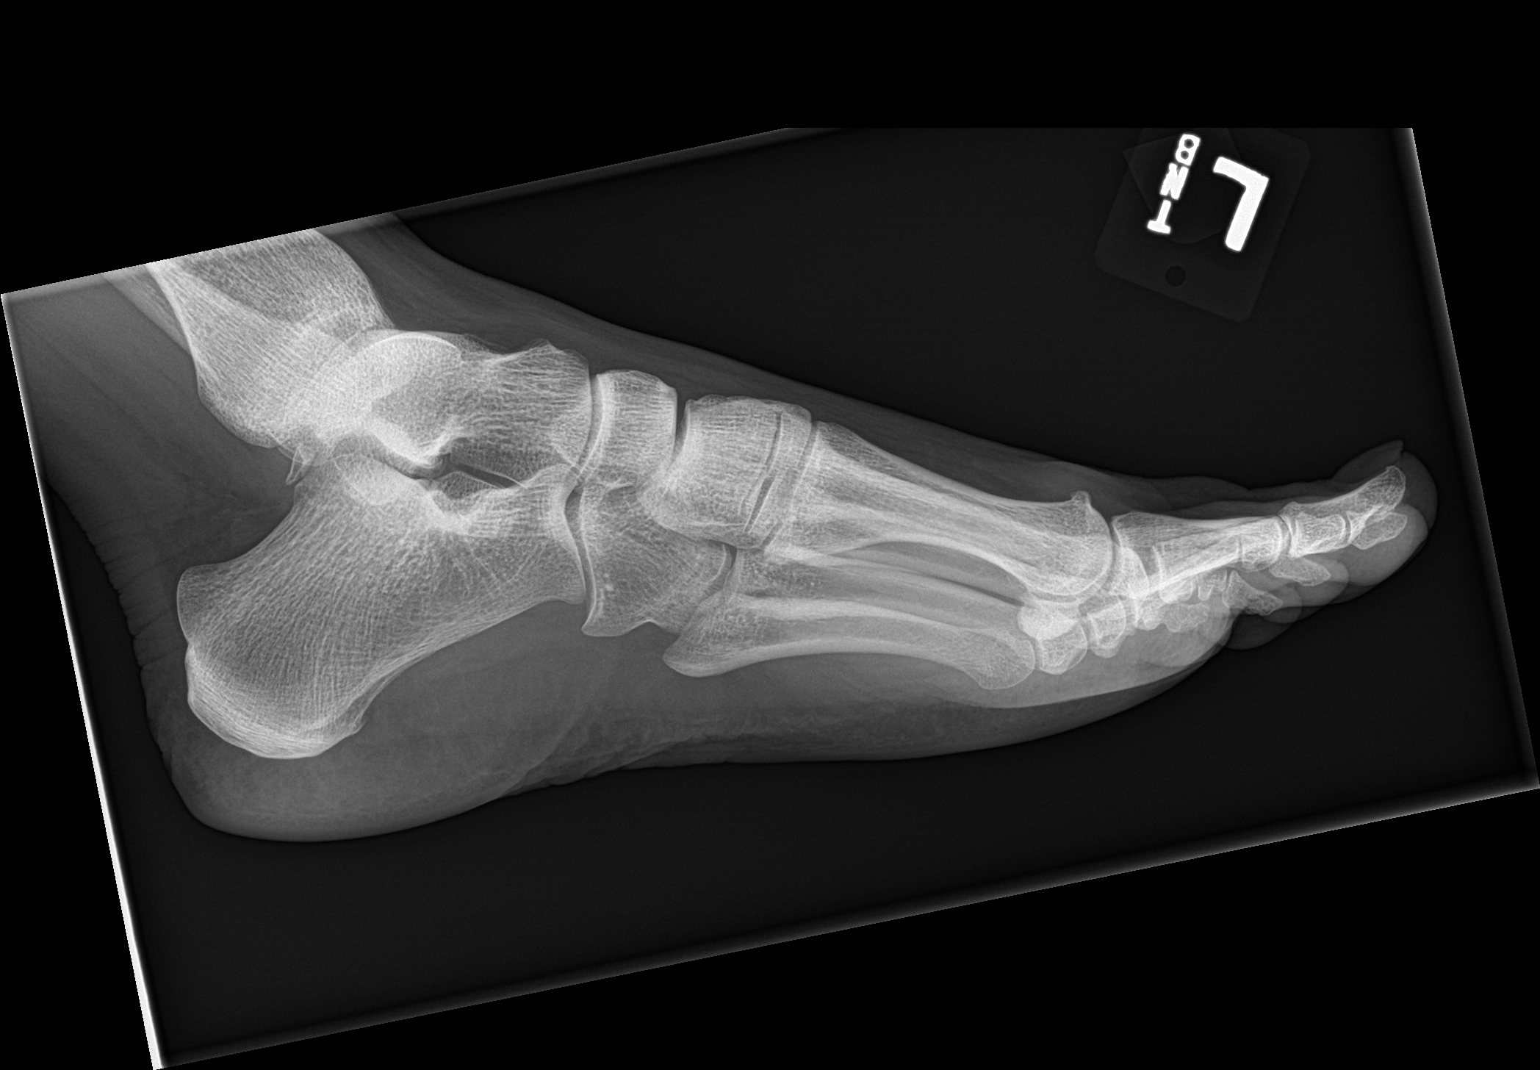

[3 of 3 positions shown; findings below may reference images not displayed]

FINDINGS: There is slight osteoarthritis of the first MTP joint. Slight bunion
formation on the head of the first metatarsal. The other bones of
the foot appear normal.
IMPRESSION: Focal osteoarthritis of the first MTP joint with slight bunion
formation.

## 2020-05-10 DIAGNOSIS — Z6833 Body mass index (BMI) 33.0-33.9, adult: Secondary | ICD-10-CM | POA: Diagnosis not present

## 2020-05-10 DIAGNOSIS — Z1331 Encounter for screening for depression: Secondary | ICD-10-CM | POA: Diagnosis not present

## 2020-05-10 DIAGNOSIS — E6609 Other obesity due to excess calories: Secondary | ICD-10-CM | POA: Diagnosis not present

## 2020-05-10 DIAGNOSIS — N342 Other urethritis: Secondary | ICD-10-CM | POA: Diagnosis not present

## 2020-05-13 DIAGNOSIS — R140 Abdominal distension (gaseous): Secondary | ICD-10-CM | POA: Diagnosis not present

## 2020-05-13 DIAGNOSIS — R1013 Epigastric pain: Secondary | ICD-10-CM | POA: Diagnosis not present

## 2020-05-13 DIAGNOSIS — Z6833 Body mass index (BMI) 33.0-33.9, adult: Secondary | ICD-10-CM | POA: Diagnosis not present

## 2020-05-13 DIAGNOSIS — K219 Gastro-esophageal reflux disease without esophagitis: Secondary | ICD-10-CM | POA: Diagnosis not present

## 2020-05-14 DIAGNOSIS — E6609 Other obesity due to excess calories: Secondary | ICD-10-CM | POA: Diagnosis not present

## 2020-05-14 DIAGNOSIS — R140 Abdominal distension (gaseous): Secondary | ICD-10-CM | POA: Diagnosis not present

## 2020-05-14 DIAGNOSIS — Z6833 Body mass index (BMI) 33.0-33.9, adult: Secondary | ICD-10-CM | POA: Diagnosis not present

## 2020-05-21 DIAGNOSIS — Z1231 Encounter for screening mammogram for malignant neoplasm of breast: Secondary | ICD-10-CM | POA: Diagnosis not present

## 2020-06-04 DIAGNOSIS — F17210 Nicotine dependence, cigarettes, uncomplicated: Secondary | ICD-10-CM | POA: Diagnosis not present

## 2020-06-04 DIAGNOSIS — K219 Gastro-esophageal reflux disease without esophagitis: Secondary | ICD-10-CM | POA: Diagnosis not present

## 2020-06-04 DIAGNOSIS — R9431 Abnormal electrocardiogram [ECG] [EKG]: Secondary | ICD-10-CM | POA: Diagnosis not present

## 2020-06-04 DIAGNOSIS — R0602 Shortness of breath: Secondary | ICD-10-CM | POA: Diagnosis not present

## 2020-06-04 DIAGNOSIS — M79601 Pain in right arm: Secondary | ICD-10-CM | POA: Diagnosis not present

## 2020-06-04 DIAGNOSIS — R079 Chest pain, unspecified: Secondary | ICD-10-CM | POA: Diagnosis not present

## 2020-06-09 DIAGNOSIS — R928 Other abnormal and inconclusive findings on diagnostic imaging of breast: Secondary | ICD-10-CM | POA: Diagnosis not present

## 2020-09-14 DIAGNOSIS — J019 Acute sinusitis, unspecified: Secondary | ICD-10-CM | POA: Diagnosis not present

## 2020-09-30 DIAGNOSIS — E782 Mixed hyperlipidemia: Secondary | ICD-10-CM | POA: Diagnosis not present

## 2020-09-30 DIAGNOSIS — E6609 Other obesity due to excess calories: Secondary | ICD-10-CM | POA: Diagnosis not present

## 2020-09-30 DIAGNOSIS — F329 Major depressive disorder, single episode, unspecified: Secondary | ICD-10-CM | POA: Diagnosis not present

## 2020-09-30 DIAGNOSIS — R635 Abnormal weight gain: Secondary | ICD-10-CM | POA: Diagnosis not present

## 2020-09-30 DIAGNOSIS — G542 Cervical root disorders, not elsewhere classified: Secondary | ICD-10-CM | POA: Diagnosis not present

## 2020-09-30 DIAGNOSIS — Z6834 Body mass index (BMI) 34.0-34.9, adult: Secondary | ICD-10-CM | POA: Diagnosis not present

## 2020-11-05 DIAGNOSIS — E6609 Other obesity due to excess calories: Secondary | ICD-10-CM | POA: Diagnosis not present

## 2020-11-05 DIAGNOSIS — E782 Mixed hyperlipidemia: Secondary | ICD-10-CM | POA: Diagnosis not present

## 2020-12-15 DIAGNOSIS — G44209 Tension-type headache, unspecified, not intractable: Secondary | ICD-10-CM | POA: Diagnosis not present

## 2020-12-23 DIAGNOSIS — E6609 Other obesity due to excess calories: Secondary | ICD-10-CM | POA: Diagnosis not present

## 2020-12-23 DIAGNOSIS — G44209 Tension-type headache, unspecified, not intractable: Secondary | ICD-10-CM | POA: Diagnosis not present

## 2020-12-23 DIAGNOSIS — F419 Anxiety disorder, unspecified: Secondary | ICD-10-CM | POA: Diagnosis not present

## 2020-12-23 DIAGNOSIS — Z6832 Body mass index (BMI) 32.0-32.9, adult: Secondary | ICD-10-CM | POA: Diagnosis not present

## 2020-12-23 DIAGNOSIS — G43709 Chronic migraine without aura, not intractable, without status migrainosus: Secondary | ICD-10-CM | POA: Diagnosis not present

## 2021-04-28 DIAGNOSIS — E6609 Other obesity due to excess calories: Secondary | ICD-10-CM | POA: Diagnosis not present

## 2021-04-28 DIAGNOSIS — K760 Fatty (change of) liver, not elsewhere classified: Secondary | ICD-10-CM | POA: Diagnosis not present

## 2021-04-28 DIAGNOSIS — B351 Tinea unguium: Secondary | ICD-10-CM | POA: Diagnosis not present

## 2021-04-28 DIAGNOSIS — G43709 Chronic migraine without aura, not intractable, without status migrainosus: Secondary | ICD-10-CM | POA: Diagnosis not present

## 2021-04-28 DIAGNOSIS — F419 Anxiety disorder, unspecified: Secondary | ICD-10-CM | POA: Diagnosis not present

## 2021-04-28 DIAGNOSIS — Z6832 Body mass index (BMI) 32.0-32.9, adult: Secondary | ICD-10-CM | POA: Diagnosis not present

## 2021-05-02 DIAGNOSIS — E6609 Other obesity due to excess calories: Secondary | ICD-10-CM | POA: Diagnosis not present

## 2021-05-02 DIAGNOSIS — K760 Fatty (change of) liver, not elsewhere classified: Secondary | ICD-10-CM | POA: Diagnosis not present

## 2021-05-17 DIAGNOSIS — K760 Fatty (change of) liver, not elsewhere classified: Secondary | ICD-10-CM | POA: Diagnosis not present

## 2021-05-31 DIAGNOSIS — K760 Fatty (change of) liver, not elsewhere classified: Secondary | ICD-10-CM | POA: Diagnosis not present

## 2021-06-03 ENCOUNTER — Other Ambulatory Visit: Payer: Self-pay | Admitting: *Deleted

## 2021-06-03 DIAGNOSIS — L0501 Pilonidal cyst with abscess: Secondary | ICD-10-CM | POA: Diagnosis not present

## 2021-06-03 DIAGNOSIS — E6609 Other obesity due to excess calories: Secondary | ICD-10-CM | POA: Diagnosis not present

## 2021-06-03 DIAGNOSIS — Z6832 Body mass index (BMI) 32.0-32.9, adult: Secondary | ICD-10-CM | POA: Diagnosis not present

## 2021-06-03 DIAGNOSIS — L0591 Pilonidal cyst without abscess: Secondary | ICD-10-CM

## 2021-06-13 ENCOUNTER — Encounter: Payer: Self-pay | Admitting: General Surgery

## 2021-06-13 ENCOUNTER — Other Ambulatory Visit: Payer: Self-pay

## 2021-06-13 ENCOUNTER — Ambulatory Visit: Payer: BC Managed Care – PPO | Admitting: General Surgery

## 2021-06-13 VITALS — BP 135/85 | HR 91 | Temp 98.5°F | Resp 14 | Ht 69.0 in | Wt 202.0 lb

## 2021-06-13 DIAGNOSIS — L0591 Pilonidal cyst without abscess: Secondary | ICD-10-CM | POA: Diagnosis not present
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
## 2021-06-28 DIAGNOSIS — K760 Fatty (change of) liver, not elsewhere classified: Secondary | ICD-10-CM | POA: Diagnosis not present

## 2021-07-11 ENCOUNTER — Telehealth: Payer: Self-pay | Admitting: *Deleted

## 2021-07-11 MED ORDER — FLUCONAZOLE 150 MG PO TABS: 150.0000 mg | ORAL_TABLET | Freq: Every day | ORAL | 0 refills | Status: DC

## 2021-07-11 MED ORDER — SULFAMETHOXAZOLE-TRIMETHOPRIM 800-160 MG PO TABS
1.0000 | ORAL_TABLET | Freq: Two times a day (BID) | ORAL | 0 refills | Status: AC
Start: 2021-07-11 — End: 2021-07-18

## 2021-07-11 NOTE — Telephone Encounter (Signed)
Received call from patient [phone]~ telephone.  Reports that pilonidal cyst has returned and ruptured. Requested ABTx.   Dr. Arnoldo Morale made aware and new orders obtained for Bactrim DS PO BID x7 days and to follow up in (1) week.   Call placed to patient and patient made aware. Requested Diflucan for yeast infection prophylaxis. Prescriptions sent to pharmacy. Appointment scheduled.

## 2021-07-19 ENCOUNTER — Ambulatory Visit: Payer: BC Managed Care – PPO | Admitting: General Surgery

## 2021-07-19 ENCOUNTER — Encounter: Payer: Self-pay | Admitting: General Surgery

## 2021-07-19 VITALS — BP 138/87 | HR 85 | Temp 98.2°F | Resp 16 | Ht 69.0 in | Wt 198.0 lb

## 2021-07-19 DIAGNOSIS — L0591 Pilonidal cyst without abscess: Secondary | ICD-10-CM | POA: Diagnosis not present
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
## 2021-07-20 NOTE — Progress Notes (Signed)
Subjective:     Veronica Jarvis  Patient returns for scheduling of her excision of the pilonidal cyst.  She did go on vacation and it did flareup again.  It has since stopped draining and she would like excision of the pilonidal cyst. Objective:    BP 138/87   Pulse 85   Temp 98.2 F (36.8 C) (Oral)   Resp 16   Ht '5\' 9"'$  (1.753 m)   Wt 198 lb (89.8 kg)   SpO2 98%   BMI 29.24 kg/m   General:  alert, cooperative, and no distress  Pilonidal cyst over coccyx without significant induration or erythema.  No drainage is present.     Assessment:    Pilonidal cyst    Plan:   Patient is scheduled for excision of the pilonidal cyst on 08/04/2021.  The risks and benefits of the procedure including bleeding, infection, wound breakdown, and the possibility of recurrence of the cyst were fully explained to the patient, who gave informed consent.

## 2021-07-28 NOTE — Patient Instructions (Signed)
Veronica Jarvis  07/28/2021     '@PREFPERIOPPHARMACY'$ @   Your procedure is scheduled on  08/04/2021.   Report to [facility] at  0600  A.M.   Call this number if you have problems the morning of surgery:  [phone]   Remember:  Do not eat or drink after midnight.    Take these medicines the morning of surgery with A SIP OF WATER                xanax(if needed), wellbutrin, zofran (if needed), protonix, maxalt(if needed).     Do not wear jewelry, make-up or nail polish.  Do not wear lotions, powders, or perfumes, or deodorant.  Do not shave 48 hours prior to surgery.  Men may shave face and neck.  Do not bring valuables to the hospital.  [facility] is not responsible for any belongings or valuables.  Contacts, dentures or bridgework may not be worn into surgery.  Leave your suitcase in the car.  After surgery it may be brought to your room.  For patients admitted to the hospital, discharge time will be determined by your treatment team.  Patients discharged the day of surgery will not be allowed to drive home and  must have someone with them for 24 hours.    Special instructions:   DO NOT smoke tobacco or vape for 24 hours before your procedure.  Please read over the following fact sheets that you were given. Coughing and Deep Breathing, Surgical Site Infection Prevention, Anesthesia Post-op Instructions, and Care and Recovery After Surgery      Pilonidal Cyst Drainage, Care After This sheet gives you information about how to care for yourself after your procedure. Your health care provider may also give you more specific instructions. If you have problems or questions, contact your health care provider. What can I expect after the procedure? After the procedure, it is common to have: Pain that gets better when you take medicine. Some fluid or blood coming from your wound. Follow these instructions at home: Medicines Take over-the-counter and prescription  medicines only as told by your health care provider. If you were prescribed an antibiotic medicine, take it as told by your health care provider. Do not stop taking the antibiotic even if you start to feel better. Lifestyle Do not do activities that irritate or put pressure on your buttocks for about 2 weeks, or as long as told by your health care provider. These activities include bike riding, running, and anything that involves a twisting motion. Do not sit for long periods at a time without getting up to move around. Sleep on your side instead of your back. Avoid wearing tight underwear and tight pants. Bathing Do not take baths or showers, swim, or use a hot tub until your health care provider approves. This depends on the type of wound you have from surgery. While bathing, clean your buttocks area gently with soap and water. After bathing: Pat the area dry with a soft, clean towel. Cover the area with a clean bandage (dressing), if told to by your health care provider. General instructions  If you are taking prescription pain medicine, take actions to prevent or treat constipation. Your health care provider may recommend that you: Drink enough fluid to keep your urine pale yellow. Eat foods that are high in fiber, such as fresh fruits and vegetables, whole grains, and beans. Limit foods that are high in fat and processed sugars,  such as fried or sweet foods. Take an over-the-counter or prescription medicine for constipation. You will need to have a caregiver help you manage wound care and dressing changes. Your caregiver should: Wash his or her hands with soap and water before changing your dressing. If soap and water are not available, your caregiver should use hand sanitizer. Check your wound every day for signs of infection, such as: Redness, swelling, or more pain. More fluid or blood. Warmth. Pus or a bad smell. Follow any additional instructions from your health care provider on  how to care for your wound, such as wound cleaning, wound flushing (irrigation), or packing your wound with a dressing. Keep all follow-up visits as told by your health care provider. This is important. If you had incision and drainage with wound packing: Return to your health care provider as instructed to have your packing material changed or removed. Keep the area dry until your packing has been removed. After the packing has been removed, you may start taking showers. If you had marsupialization: You may start taking showers the day after surgery, or when your health care provider approves. Remove your dressing before you shower, but let the water from the shower moisten your dressing before you remove it. This will make it easier to remove. Ask your health care provider when you can stop using a dressing. If you had incision and drainage without wound packing: Change your dressing as directed. Leave stitches (sutures), skin glue, or adhesive strips in place. These skin closures may need to stay in place for 2 weeks or longer. If adhesive strip edges start to loosen and curl up, you may trim the loose edges. Do not remove adhesive strips completely unless your health care provider tells you to do that. Contact a health care provider if: You have redness, swelling, or more pain around your wound. You have more fluid or blood coming from your wound. You have new bleeding from your wound. Your wound feels warm to the touch. There is pus or a bad smell coming from your wound. You have pain that does not get better with medicine. You have a fever or chills. You have muscle aches. You are dizzy. You feel generally sick. Summary After a procedure to drain a pilonidal cyst, it is common to have some fluid or blood coming from your wound. If you were prescribed an antibiotic medicine, take it as told by your health care provider. Do not stop taking the antibiotic even if you start to feel  better. Return to your health care provider as instructed to have any packing material changed or removed. This information is not intended to replace advice given to you by your health care provider. Make sure you discuss any questions you have with your health care provider. Document Revised: 12/18/2019 Document Reviewed: 12/18/2019 Elsevier Patient Education  [place] Anesthesia, Adult, Care After This sheet gives you information about how to care for yourself after your procedure. Your health care provider may also give you more specific instructions. If you have problems or questions, contact your health care provider. What can I expect after the procedure? After the procedure, the following side effects are common: Pain or discomfort at the IV site. Nausea. Vomiting. Sore throat. Trouble concentrating. Feeling cold or chills. Feeling weak or tired. Sleepiness and fatigue. Soreness and body aches. These side effects can affect parts of the body that were not involved in surgery. Follow these instructions at home: For the time  period you were told by your health care provider:  Rest. Do not participate in activities where you could fall or become injured. Do not drive or use machinery. Do not drink alcohol. Do not take sleeping pills or medicines that cause drowsiness. Do not make important decisions or sign legal documents. Do not take care of children on your own. Eating and drinking Follow any instructions from your health care provider about eating or drinking restrictions. When you feel hungry, start by eating small amounts of foods that are soft and easy to digest (bland), such as toast. Gradually return to your regular diet. Drink enough fluid to keep your urine pale yellow. If you vomit, rehydrate by drinking water, juice, or clear broth. General instructions If you have sleep apnea, surgery and certain medicines can increase your risk for breathing  problems. Follow instructions from your health care provider about wearing your sleep device: Anytime you are sleeping, including during daytime naps. While taking prescription pain medicines, sleeping medicines, or medicines that make you drowsy. Have a responsible adult stay with you for the time you are told. It is important to have someone help care for you until you are awake and alert. Return to your normal activities as told by your health care provider. Ask your health care provider what activities are safe for you. Take over-the-counter and prescription medicines only as told by your health care provider. If you smoke, do not smoke without supervision. Keep all follow-up visits as told by your health care provider. This is important. Contact a health care provider if: You have nausea or vomiting that does not get better with medicine. You cannot eat or drink without vomiting. You have pain that does not get better with medicine. You are unable to pass urine. You develop a skin rash. You have a fever. You have redness around your IV site that gets worse. Get help right away if: You have difficulty breathing. You have chest pain. You have blood in your urine or stool, or you vomit blood. Summary After the procedure, it is common to have a sore throat or nausea. It is also common to feel tired. Have a responsible adult stay with you for the time you are told. It is important to have someone help care for you until you are awake and alert. When you feel hungry, start by eating small amounts of foods that are soft and easy to digest (bland), such as toast. Gradually return to your regular diet. Drink enough fluid to keep your urine pale yellow. Return to your normal activities as told by your health care provider. Ask your health care provider what activities are safe for you. This information is not intended to replace advice given to you by your health care provider. Make sure you  discuss any questions you have with your health care provider. Document Revised: 10/23/2019 Document Reviewed: 05/22/2019 Elsevier Patient Education  [place]. How to Use Chlorhexidine for Bathing Chlorhexidine gluconate (CHG) is a germ-killing (antiseptic) solution that is used to clean the skin. It can get rid of the bacteria that normally live on the skin and can keep them away for about 24 hours. To clean your skin with CHG, you may be given: A CHG solution to use in the shower or as part of a sponge bath. A prepackaged cloth that contains CHG. Cleaning your skin with CHG may help lower the risk for infection: While you are staying in the intensive care unit of the hospital.  If you have a vascular access, such as a central line, to provide short-term or long-term access to your veins. If you have a catheter to drain urine from your bladder. If you are on a ventilator. A ventilator is a machine that helps you breathe by moving air in and out of your lungs. After surgery. What are the risks? Risks of using CHG include: A skin reaction. Hearing loss, if CHG gets in your ears and you have a perforated eardrum. Eye injury, if CHG gets in your eyes and is not rinsed out. The CHG product catching fire. Make sure that you avoid smoking and flames after applying CHG to your skin. Do not use CHG: If you have a chlorhexidine allergy or have previously reacted to chlorhexidine. On babies younger than 34 months of age. How to use CHG solution Use CHG only as told by your health care provider, and follow the instructions on the label. Use the full amount of CHG as directed. Usually, this is one bottle. During a shower Follow these steps when using CHG solution during a shower (unless your health care provider gives you different instructions): Start the shower. Use your normal soap and shampoo to wash your face and hair. Turn off the shower or move out of the shower stream. Pour the CHG  onto a clean washcloth. Do not use any type of brush or rough-edged sponge. Starting at your neck, lather your body down to your toes. Make sure you follow these instructions: If you will be having surgery, pay special attention to the part of your body where you will be having surgery. Scrub this area for at least 1 minute. Do not use CHG on your head or face. If the solution gets into your ears or eyes, rinse them well with water. Avoid your genital area. Avoid any areas of skin that have broken skin, cuts, or scrapes. Scrub your back and under your arms. Make sure to wash skin folds. Let the lather sit on your skin for 1-2 minutes or as long as told by your health care provider. Thoroughly rinse your entire body in the shower. Make sure that all body creases and crevices are rinsed well. Dry off with a clean towel. Do not put any substances on your body afterward--such as powder, lotion, or perfume--unless you are told to do so by your health care provider. Only use lotions that are recommended by the manufacturer. Put on clean clothes or pajamas. If it is the night before your surgery, sleep in clean sheets.  During a sponge bath Follow these steps when using CHG solution during a sponge bath (unless your health care provider gives you different instructions): Use your normal soap and shampoo to wash your face and hair. Pour the CHG onto a clean washcloth. Starting at your neck, lather your body down to your toes. Make sure you follow these instructions: If you will be having surgery, pay special attention to the part of your body where you will be having surgery. Scrub this area for at least 1 minute. Do not use CHG on your head or face. If the solution gets into your ears or eyes, rinse them well with water. Avoid your genital area. Avoid any areas of skin that have broken skin, cuts, or scrapes. Scrub your back and under your arms. Make sure to wash skin folds. Let the lather sit on  your skin for 1-2 minutes or as long as told by your health care provider. Using a  different clean, wet washcloth, thoroughly rinse your entire body. Make sure that all body creases and crevices are rinsed well. Dry off with a clean towel. Do not put any substances on your body afterward--such as powder, lotion, or perfume--unless you are told to do so by your health care provider. Only use lotions that are recommended by the manufacturer. Put on clean clothes or pajamas. If it is the night before your surgery, sleep in clean sheets. How to use CHG prepackaged cloths Only use CHG cloths as told by your health care provider, and follow the instructions on the label. Use the CHG cloth on clean, dry skin. Do not use the CHG cloth on your head or face unless your health care provider tells you to. When washing with the CHG cloth: Avoid your genital area. Avoid any areas of skin that have broken skin, cuts, or scrapes. Before surgery Follow these steps when using a CHG cloth to clean before surgery (unless your health care provider gives you different instructions): Using the CHG cloth, vigorously scrub the part of your body where you will be having surgery. Scrub using a back-and-forth motion for 3 minutes. The area on your body should be completely wet with CHG when you are done scrubbing. Do not rinse. Discard the cloth and let the area air-dry. Do not put any substances on the area afterward, such as powder, lotion, or perfume. Put on clean clothes or pajamas. If it is the night before your surgery, sleep in clean sheets.  For general bathing Follow these steps when using CHG cloths for general bathing (unless your health care provider gives you different instructions). Use a separate CHG cloth for each area of your body. Make sure you wash between any folds of skin and between your fingers and toes. Wash your body in the following order, switching to a new cloth after each step: The front of  your neck, shoulders, and chest. Both of your arms, under your arms, and your hands. Your stomach and groin area, avoiding the genitals. Your right leg and foot. Your left leg and foot. The back of your neck, your back, and your buttocks. Do not rinse. Discard the cloth and let the area air-dry. Do not put any substances on your body afterward--such as powder, lotion, or perfume--unless you are told to do so by your health care provider. Only use lotions that are recommended by the manufacturer. Put on clean clothes or pajamas. Contact a health care provider if: Your skin gets irritated after scrubbing. You have questions about using your solution or cloth. You swallow any chlorhexidine. Call your local poison control center (1-228-740-1789 in the [place] Get help right away if: Your eyes itch badly, or they become very red or swollen. Your skin itches badly and is red or swollen. Your hearing changes. You have trouble seeing. You have swelling or tingling in your mouth or throat. You have trouble breathing. These symptoms may represent a serious problem that is an emergency. Do not wait to see if the symptoms will go away. Get medical help right away. Call your local emergency services (911 in the [place] Do not drive yourself to the hospital. Summary Chlorhexidine gluconate (CHG) is a germ-killing (antiseptic) solution that is used to clean the skin. Cleaning your skin with CHG may help to lower your risk for infection. You may be given CHG to use for bathing. It may be in a bottle or in a prepackaged cloth to use on your skin. Carefully  follow your health care provider's instructions and the instructions on the product label. Do not use CHG if you have a chlorhexidine allergy. Contact your health care provider if your skin gets irritated after scrubbing. This information is not intended to replace advice given to you by your health care provider. Make sure you discuss any questions you have  with your health care provider. Document Revised: 04/19/2020 Document Reviewed: 04/19/2020 Elsevier Patient Education  [place].

## 2021-08-01 ENCOUNTER — Encounter (HOSPITAL_COMMUNITY)
Admission: RE | Admit: 2021-08-01 | Discharge: 2021-08-01 | Disposition: A | Discharge status: Home / Self Care | Payer: BC Managed Care – PPO | Source: Ambulatory Visit | Attending: General Surgery | Admitting: General Surgery

## 2021-08-01 VITALS — BP 136/82 | HR 85 | Temp 98.2°F | Resp 18 | Ht 69.0 in | Wt 200.0 lb

## 2021-08-01 DIAGNOSIS — L0591 Pilonidal cyst without abscess: Secondary | ICD-10-CM | POA: Diagnosis not present

## 2021-08-01 DIAGNOSIS — Z01818 Encounter for other preprocedural examination: Secondary | ICD-10-CM

## 2021-08-01 DIAGNOSIS — K219 Gastro-esophageal reflux disease without esophagitis: Secondary | ICD-10-CM | POA: Diagnosis not present

## 2021-08-01 DIAGNOSIS — Z01812 Encounter for preprocedural laboratory examination: Secondary | ICD-10-CM | POA: Insufficient documentation

## 2021-08-01 DIAGNOSIS — Z87891 Personal history of nicotine dependence: Secondary | ICD-10-CM | POA: Diagnosis not present

## 2021-08-01 DIAGNOSIS — F418 Other specified anxiety disorders: Secondary | ICD-10-CM | POA: Diagnosis not present

## 2021-08-03 LAB — POCT PREGNANCY, URINE: Preg Test, Ur: NEGATIVE

## 2021-08-04 ENCOUNTER — Other Ambulatory Visit: Payer: Self-pay

## 2021-08-04 ENCOUNTER — Encounter (HOSPITAL_COMMUNITY): Payer: Self-pay | Admitting: General Surgery

## 2021-08-04 ENCOUNTER — Ambulatory Visit (HOSPITAL_COMMUNITY): Payer: BC Managed Care – PPO | Admitting: Certified Registered"

## 2021-08-04 ENCOUNTER — Ambulatory Visit (HOSPITAL_COMMUNITY)
Admission: RE | Admit: 2021-08-04 | Discharge: 2021-08-04 | Disposition: A | Discharge status: Home / Self Care | Payer: BC Managed Care – PPO | Attending: General Surgery | Admitting: General Surgery

## 2021-08-04 ENCOUNTER — Encounter (HOSPITAL_COMMUNITY)
Admission: RE | Disposition: A | Discharge status: Home / Self Care | Payer: Self-pay | Source: Home / Self Care | Attending: General Surgery

## 2021-08-04 DIAGNOSIS — Z87891 Personal history of nicotine dependence: Secondary | ICD-10-CM | POA: Insufficient documentation

## 2021-08-04 DIAGNOSIS — K219 Gastro-esophageal reflux disease without esophagitis: Secondary | ICD-10-CM | POA: Insufficient documentation

## 2021-08-04 DIAGNOSIS — Z01818 Encounter for other preprocedural examination: Secondary | ICD-10-CM

## 2021-08-04 DIAGNOSIS — L0591 Pilonidal cyst without abscess: Secondary | ICD-10-CM | POA: Insufficient documentation

## 2021-08-04 DIAGNOSIS — F418 Other specified anxiety disorders: Secondary | ICD-10-CM | POA: Insufficient documentation

## 2021-08-04 SURGERY — EXCISION, SIMPLE PILONIDAL CYST
Anesthesia: General | Site: Back

## 2021-08-04 MED ORDER — KETAMINE HCL 50 MG/5ML IJ SOSY
PREFILLED_SYRINGE | INTRAMUSCULAR | Status: AC
  Filled 2021-08-04: qty 5

## 2021-08-04 MED ORDER — DEXAMETHASONE SODIUM PHOSPHATE 10 MG/ML IJ SOLN
INTRAMUSCULAR | Status: DC | PRN
  Administered 2021-08-04: 5 mg via INTRAVENOUS

## 2021-08-04 MED ORDER — PROPOFOL 10 MG/ML IV BOLUS
INTRAVENOUS | Status: DC | PRN
  Administered 2021-08-04: 200 mg via INTRAVENOUS

## 2021-08-04 MED ORDER — HYDROCODONE-ACETAMINOPHEN 5-325 MG PO TABS
1.0000 | ORAL_TABLET | ORAL | 0 refills | Status: DC | PRN
Start: 2021-08-04 — End: 2021-08-11

## 2021-08-04 MED ORDER — POVIDONE-IODINE 10 % EX OINT
TOPICAL_OINTMENT | CUTANEOUS | Status: AC
  Filled 2021-08-04: qty 1

## 2021-08-04 MED ORDER — LACTATED RINGERS IV SOLN: INTRAVENOUS | Status: DC | PRN

## 2021-08-04 MED ORDER — LIDOCAINE HCL (PF) 1 % IJ SOLN
INTRAMUSCULAR | Status: AC
  Filled 2021-08-04: qty 30

## 2021-08-04 MED ORDER — BUPIVACAINE HCL (PF) 0.5 % IJ SOLN
INTRAMUSCULAR | Status: AC
  Filled 2021-08-04: qty 30

## 2021-08-04 MED ORDER — CEFAZOLIN SODIUM-DEXTROSE 2-4 GM/100ML-% IV SOLN
2.0000 g | INTRAVENOUS | Status: AC
  Administered 2021-08-04: 2 g via INTRAVENOUS
  Filled 2021-08-04: qty 100

## 2021-08-04 MED ORDER — HYDROGEN PEROXIDE 3 % EX SOLN
CUTANEOUS | Status: DC | PRN
  Administered 2021-08-04: 1

## 2021-08-04 MED ORDER — SODIUM CHLORIDE 0.9 % IR SOLN
Status: DC | PRN
  Administered 2021-08-04: 1000 mL

## 2021-08-04 MED ORDER — ROCURONIUM BROMIDE 10 MG/ML (PF) SYRINGE
PREFILLED_SYRINGE | INTRAVENOUS | Status: AC
  Filled 2021-08-04: qty 10

## 2021-08-04 MED ORDER — LIDOCAINE 2% (20 MG/ML) 5 ML SYRINGE
INTRAMUSCULAR | Status: DC | PRN
  Administered 2021-08-04: 80 mg via INTRAVENOUS

## 2021-08-04 MED ORDER — BUPIVACAINE HCL (PF) 0.5 % IJ SOLN
INTRAMUSCULAR | Status: DC | PRN
  Administered 2021-08-04: 10 mL

## 2021-08-04 MED ORDER — ROCURONIUM BROMIDE 10 MG/ML (PF) SYRINGE
PREFILLED_SYRINGE | INTRAVENOUS | Status: DC | PRN
  Administered 2021-08-04: 60 mg via INTRAVENOUS

## 2021-08-04 MED ORDER — OXYCODONE HCL 5 MG/5ML PO SOLN: 5.0000 mg | Freq: Once | ORAL | Status: DC | PRN

## 2021-08-04 MED ORDER — CHLORHEXIDINE GLUCONATE CLOTH 2 % EX PADS: 6.0000 | MEDICATED_PAD | Freq: Once | CUTANEOUS | Status: DC

## 2021-08-04 MED ORDER — LIDOCAINE HCL (PF) 2 % IJ SOLN
INTRAMUSCULAR | Status: AC
  Filled 2021-08-04: qty 10

## 2021-08-04 MED ORDER — KETAMINE HCL 10 MG/ML IJ SOLN
INTRAMUSCULAR | Status: DC | PRN
  Administered 2021-08-04: 10 mg via INTRAVENOUS
  Administered 2021-08-04: 20 mg via INTRAVENOUS

## 2021-08-04 MED ORDER — PROPOFOL 10 MG/ML IV BOLUS
INTRAVENOUS | Status: AC
  Filled 2021-08-04: qty 20

## 2021-08-04 MED ORDER — FENTANYL CITRATE PF 50 MCG/ML IJ SOSY: 25.0000 ug | PREFILLED_SYRINGE | INTRAMUSCULAR | Status: DC | PRN

## 2021-08-04 MED ORDER — MIDAZOLAM HCL 2 MG/2ML IJ SOLN
INTRAMUSCULAR | Status: AC
  Filled 2021-08-04: qty 2

## 2021-08-04 MED ORDER — MIDAZOLAM HCL 5 MG/5ML IJ SOLN
INTRAMUSCULAR | Status: DC | PRN
  Administered 2021-08-04: 2 mg via INTRAVENOUS

## 2021-08-04 MED ORDER — ONDANSETRON HCL 4 MG/2ML IJ SOLN
INTRAMUSCULAR | Status: AC
  Filled 2021-08-04: qty 2

## 2021-08-04 MED ORDER — FENTANYL CITRATE (PF) 100 MCG/2ML IJ SOLN
INTRAMUSCULAR | Status: AC
  Filled 2021-08-04: qty 2

## 2021-08-04 MED ORDER — DEXAMETHASONE SODIUM PHOSPHATE 10 MG/ML IJ SOLN
INTRAMUSCULAR | Status: AC
  Filled 2021-08-04: qty 1

## 2021-08-04 MED ORDER — ONDANSETRON HCL 4 MG/2ML IJ SOLN: 4.0000 mg | Freq: Once | INTRAMUSCULAR | Status: DC | PRN

## 2021-08-04 MED ORDER — SUGAMMADEX SODIUM 200 MG/2ML IV SOLN
INTRAVENOUS | Status: DC | PRN
  Administered 2021-08-04: 200 mg via INTRAVENOUS

## 2021-08-04 MED ORDER — OXYCODONE HCL 5 MG PO TABS: 5.0000 mg | ORAL_TABLET | Freq: Once | ORAL | Status: DC | PRN

## 2021-08-04 MED ORDER — FENTANYL CITRATE (PF) 100 MCG/2ML IJ SOLN
INTRAMUSCULAR | Status: DC | PRN
  Administered 2021-08-04 (×2): 50 ug via INTRAVENOUS

## 2021-08-04 MED ORDER — ONDANSETRON HCL 4 MG/2ML IJ SOLN
INTRAMUSCULAR | Status: DC | PRN
  Administered 2021-08-04: 4 mg via INTRAVENOUS

## 2021-08-04 MED ORDER — POVIDONE-IODINE 10 % OINT PACKET
TOPICAL_OINTMENT | CUTANEOUS | Status: DC | PRN
  Administered 2021-08-04: 1 via TOPICAL

## 2021-08-04 MED ORDER — KETOROLAC TROMETHAMINE 30 MG/ML IJ SOLN
30.0000 mg | Freq: Once | INTRAMUSCULAR | Status: AC
  Administered 2021-08-04: 30 mg via INTRAVENOUS
  Filled 2021-08-04: qty 1

## 2021-08-04 SURGICAL SUPPLY — 32 items
APL PRP STRL LF ISPRP CHG 10.5 (MISCELLANEOUS) ×1
APPLICATOR CHLORAPREP 10.5 ORG (MISCELLANEOUS) ×1 IMPLANT
BLADE SURG SZ11 CARB STEEL (BLADE) ×2 IMPLANT
CANNULA VESSEL 3MM 2 BLNT TIP (CANNULA) ×2 IMPLANT
CLOTH BEACON ORANGE TIMEOUT ST (SAFETY) ×2 IMPLANT
COVER LIGHT HANDLE STERIS (MISCELLANEOUS) ×4 IMPLANT
DRSG TEGADERM 2-3/8X2-3/4 SM (GAUZE/BANDAGES/DRESSINGS) ×1 IMPLANT
ELECT REM PT RETURN 9FT ADLT (ELECTROSURGICAL) ×2
ELECTRODE REM PT RTRN 9FT ADLT (ELECTROSURGICAL) ×1 IMPLANT
GAUZE SPONGE 4X4 12PLY STRL (GAUZE/BANDAGES/DRESSINGS) ×3 IMPLANT
GAUZE SPONGE 4X4 12PLY STRL LF (GAUZE/BANDAGES/DRESSINGS) ×2 IMPLANT
GAUZE XEROFORM 5X9 LF (GAUZE/BANDAGES/DRESSINGS) ×1 IMPLANT
GLOVE BIOGEL PI IND STRL 7.0 (GLOVE) ×2 IMPLANT
GLOVE BIOGEL PI INDICATOR 7.0 (GLOVE) ×2
GLOVE SURG SS PI 7.5 STRL IVOR (GLOVE) ×2 IMPLANT
GOWN STRL REUS W/TWL LRG LVL3 (GOWN DISPOSABLE) ×4 IMPLANT
KIT TURNOVER KIT A (KITS) ×2 IMPLANT
MANIFOLD NEPTUNE II (INSTRUMENTS) ×2 IMPLANT
NDL HYPO 25X1 1.5 SAFETY (NEEDLE) ×1 IMPLANT
NEEDLE HYPO 25X1 1.5 SAFETY (NEEDLE) ×2 IMPLANT
NS IRRIG 1000ML POUR BTL (IV SOLUTION) ×2 IMPLANT
PACK MINOR (CUSTOM PROCEDURE TRAY) ×2 IMPLANT
PAD ABD 5X9 TENDERSORB (GAUZE/BANDAGES/DRESSINGS) ×2 IMPLANT
PAD ARMBOARD 7.5X6 YLW CONV (MISCELLANEOUS) ×2 IMPLANT
PENCIL SMOKE EVACUATOR (MISCELLANEOUS) ×2 IMPLANT
SET BASIN LINEN APH (SET/KITS/TRAYS/PACK) ×2 IMPLANT
SPONGE GAUZE 2X2 8PLY STRL LF (GAUZE/BANDAGES/DRESSINGS) ×1 IMPLANT
SPONGE T-LAP 18X18 ~~LOC~~+RFID (SPONGE) ×2 IMPLANT
SUT PROLENE 2 0 FS (SUTURE) ×2 IMPLANT
SUT PROLENE 3 0 PS 2 (SUTURE) ×1 IMPLANT
SYR 30ML LL (SYRINGE) ×2 IMPLANT
SYR CONTROL 10ML LL (SYRINGE) ×2 IMPLANT

## 2021-08-04 NOTE — Anesthesia Preprocedure Evaluation (Signed)
Anesthesia Evaluation  Patient identified by MRN, date of birth, ID band Patient awake    Reviewed: Allergy & Precautions, H&P , NPO status , Patient's Chart, lab work & pertinent test results, reviewed documented beta blocker date and time   Airway Mallampati: II  TM Distance: >3 FB Neck ROM: full    Dental no notable dental hx.    Pulmonary neg pulmonary ROS, former smoker,    Pulmonary exam normal breath sounds clear to auscultation       Cardiovascular Exercise Tolerance: Good negative cardio ROS   Rhythm:regular Rate:Normal     Neuro/Psych  Headaches, PSYCHIATRIC DISORDERS Anxiety Depression    GI/Hepatic Neg liver ROS, GERD  Medicated,  Endo/Other  negative endocrine ROS  Renal/GU negative Renal ROS  negative genitourinary   Musculoskeletal   Abdominal   Peds  Hematology negative hematology ROS (+)   Anesthesia Other Findings   Reproductive/Obstetrics negative OB ROS                             Anesthesia Physical Anesthesia Plan  ASA: 2  Anesthesia Plan: General and General LMA   Post-op Pain Management:    Induction:   PONV Risk Score and Plan: Ondansetron  Airway Management Planned:   Additional Equipment:   Intra-op Plan:   Post-operative Plan:   Informed Consent: I have reviewed the patients History and Physical, chart, labs and discussed the procedure including the risks, benefits and alternatives for the proposed anesthesia with the patient or authorized representative who has indicated his/her understanding and acceptance.     Dental Advisory Given  Plan Discussed with: CRNA  Anesthesia Plan Comments:         Anesthesia Quick Evaluation

## 2021-08-04 NOTE — Interval H&P Note (Signed)
History and Physical Interval Note:  08/04/2021 8:19 AM  Veronica Jarvis  has presented today for surgery, with the diagnosis of PILONIDAL CYST.  The various methods of treatment have been discussed with the patient and family. After consideration of risks, benefits and other options for treatment, the patient has consented to  Procedure(s) with comments: CYST EXCISION PILONIDAL SIMPLE (N/A) - pt knows to arrive at 7:00 as a surgical intervention.  The patient's history has been reviewed, patient examined, no change in status, stable for surgery.  I have reviewed the patient's chart and labs.  Questions were answered to the patient's satisfaction.     Aviva Signs

## 2021-08-04 NOTE — Anesthesia Procedure Notes (Signed)
Procedure Name: Intubation Date/Time: 08/04/2021 9:11 AM  Performed by: Myna Bright, CRNAPre-anesthesia Checklist: Patient identified, Emergency Drugs available, Suction available and Patient being monitored Patient Re-evaluated:Patient Re-evaluated prior to induction Oxygen Delivery Method: Circle system utilized Preoxygenation: Pre-oxygenation with 100% oxygen Induction Type: IV induction Ventilation: Mask ventilation without difficulty Laryngoscope Size: Mac and 3 Grade View: Grade II Tube type: Oral Tube size: 7.0 mm Number of attempts: 1 Airway Equipment and Method: Stylet Placement Confirmation: ETT inserted through vocal cords under direct vision, positive ETCO2 and breath sounds checked- equal and bilateral Secured at: 21 cm Tube secured with: Tape Dental Injury: Teeth and Oropharynx as per pre-operative assessment

## 2021-08-04 NOTE — Op Note (Signed)
Patient:  Veronica Jarvis  DOB:  1975-08-09  MRN:  [id number]   Preop Diagnosis: Pilonidal cyst  Postop Diagnosis: Same  Procedure: Excision of pilonidal cyst  Surgeon: Aviva Signs, MD  Anes: General endotracheal  Indications: Patient is a 46 year old white female with recurrent episodes of an inflamed pilonidal cyst.  The risks and benefits of the procedure including bleeding, infection, and recurrence of the pilonidal cyst were fully explained to the patient, who gave informed consent.  Procedure note: The patient was placed in the left lateral decubitus position after induction of general endotracheal anesthesia.  The pilonidal region was prepped and draped using the usual sterile technique with ChloraPrep.  Surgical site confirmation was performed.  The patient had a previous transverse incision and drainage of a pilonidal cyst in the past.  She had granulomatous tissue underneath the scar.  She had 2 punctum's just caudad along the midline in the area.  An elliptical incision was made to include much of the scar.  The skin and subcutaneous tissue were excised down to the coccyx and sent to pathology for further examination.  A bleeding was controlled using Bovie electrocautery.  The wound was irrigated with hydrogen peroxide.  0.5% Sensorcaine was instilled into the surrounding wound.  The skin was closed using 2-0 and 3-0 Prolene interrupted sutures.  Betadine ointment and a dry sterile dressing were applied.  All tape and needle counts were correct at the end of the procedure.  The patient was extubated in the operating room and transferred to PACU in stable condition.  Complications: None  EBL: Minimal  Specimen: Pilonidal cyst

## 2021-08-04 NOTE — Transfer of Care (Signed)
Immediate Anesthesia Transfer of Care Note  Patient: Veronica Jarvis  Procedure(s) Performed: CYST EXCISION PILONIDAL SIMPLE (Back)  Patient Location: PACU  Anesthesia Type:General  Level of Consciousness: awake, alert , oriented and patient cooperative  Airway & Oxygen Therapy: Patient Spontanous Breathing and Patient connected to nasal cannula oxygen  Post-op Assessment: Report given to RN, Post -op Vital signs reviewed and stable and Patient moving all extremities  Post vital signs: Reviewed and stable  Last Vitals:  Vitals Value Taken Time  BP 137/70 08/04/21 1000  Temp 36.4 C 08/04/21 0955  Pulse 101 08/04/21 1001  Resp 13 08/04/21 1001  SpO2 100 % 08/04/21 1001  Vitals shown include unvalidated device data.  Last Pain:  Vitals:   08/04/21 0720  TempSrc: Oral  PainSc: 0-No pain         Complications: No notable events documented.

## 2021-08-05 LAB — SURGICAL PATHOLOGY

## 2021-08-05 NOTE — Anesthesia Postprocedure Evaluation (Signed)
Anesthesia Post Note  Patient: [name]  Procedure(s) Performed: CYST EXCISION PILONIDAL SIMPLE (Back)  Patient location during evaluation: Phase II Anesthesia Type: General Level of consciousness: awake Pain management: pain level controlled Vital Signs Assessment: post-procedure vital signs reviewed and stable Respiratory status: spontaneous breathing and respiratory function stable Cardiovascular status: blood pressure returned to baseline and stable Postop Assessment: no headache and no apparent nausea or vomiting Anesthetic complications: no Comments: Late entry   No notable events documented.   Last Vitals:  Vitals:   08/04/21 1030 08/04/21 1040  BP: 133/77 (!) 142/89  Pulse: 87 80  Resp: 14 15  Temp:    SpO2: 98% 99%    Last Pain:  Vitals:   08/05/21 1028  TempSrc:   PainSc: 0-No pain                 [name]

## 2021-08-11 ENCOUNTER — Encounter: Payer: Self-pay | Admitting: General Surgery

## 2021-08-11 ENCOUNTER — Ambulatory Visit (INDEPENDENT_AMBULATORY_CARE_PROVIDER_SITE_OTHER): Payer: BC Managed Care – PPO | Admitting: General Surgery

## 2021-08-11 VITALS — BP 145/88 | HR 72 | Temp 98.0°F | Resp 14 | Ht 69.0 in | Wt 202.0 lb

## 2021-08-11 DIAGNOSIS — Z09 Encounter for follow-up examination after completed treatment for conditions other than malignant neoplasm: Secondary | ICD-10-CM

## 2021-08-11 NOTE — Addendum Note (Signed)
Addendum  created 08/11/21 1011 by Karna Dupes, CRNA   Intraprocedure Staff edited

## 2021-08-11 NOTE — Progress Notes (Signed)
Subjective:     Veronica Jarvis  Here for postoperative visit, status post excision of pilonidal cyst.  Patient still having mild incisional pain.  Some serosanguineous drainage is present.  No purulent drainage is noted. Objective:    BP (!) 145/88   Pulse 72   Temp 98 F (36.7 C) (Oral)   Resp 14   Ht '5\' 9"'$  (1.753 m)   Wt 202 lb (91.6 kg)   SpO2 97%   BMI 29.83 kg/m   General:  alert, cooperative, and no distress  Pilonidal incision healing well with minimal separation of the wound.  No purulent drainage noted.  Sutures were loose and removed.     Assessment:    Doing well postoperatively.    Plan:   Keep wound clean and dry with soap and water.  Follow-up here in 2 weeks.

## 2021-08-24 ENCOUNTER — Ambulatory Visit (INDEPENDENT_AMBULATORY_CARE_PROVIDER_SITE_OTHER): Payer: BC Managed Care – PPO | Admitting: General Surgery

## 2021-08-24 ENCOUNTER — Encounter: Payer: Self-pay | Admitting: General Surgery

## 2021-08-24 VITALS — BP 146/83 | HR 76 | Temp 98.3°F | Resp 16 | Ht 69.0 in | Wt 203.0 lb

## 2021-08-24 DIAGNOSIS — Z09 Encounter for follow-up examination after completed treatment for conditions other than malignant neoplasm: Secondary | ICD-10-CM

## 2021-08-24 NOTE — Progress Notes (Signed)
Subjective:     Veronica Jarvis  Patient here for wound check, status post excision of pilonidal cyst.  The wound has opened up and is just draining serosanguineous fluid.  No purulent drainage is noted. Objective:    BP (!) 146/83   Pulse 76   Temp 98.3 F (36.8 C) (Oral)   Resp 16   Ht '5\' 9"'$  (1.753 m)   Wt 203 lb (92.1 kg)   SpO2 98%   BMI 29.98 kg/m   General:  alert, cooperative, and no distress  The incision has opened up, but is granulating in nicely.  No erythema or purulent drainage present.  Silver nitrate applied to the granulation tissue.     Assessment:    Status post excision of pilonidal cyst, wound healing by secondary intention.    Plan:   I told the patient to keep the wound clean and dry.  I will see her again in 3 weeks for follow-up.

## 2021-09-13 ENCOUNTER — Encounter: Payer: Self-pay | Admitting: General Surgery

## 2021-09-13 ENCOUNTER — Ambulatory Visit (INDEPENDENT_AMBULATORY_CARE_PROVIDER_SITE_OTHER): Payer: BC Managed Care – PPO | Admitting: General Surgery

## 2021-09-13 VITALS — BP 138/88 | HR 75 | Temp 98.3°F | Resp 14 | Ht 69.0 in | Wt 201.0 lb

## 2021-09-13 DIAGNOSIS — Z09 Encounter for follow-up examination after completed treatment for conditions other than malignant neoplasm: Secondary | ICD-10-CM

## 2021-09-13 NOTE — Progress Notes (Signed)
Subjective:     Veronica Jarvis  Patient here for postoperative visit.  She is pleased with the results.  No drainage has been noted.  The wound has almost closed over. Objective:    BP 138/88   Pulse 75   Temp 98.3 F (36.8 C) (Oral)   Resp 14   Ht '5\' 9"'$  (1.753 m)   Wt 201 lb (91.2 kg)   SpO2 98%   BMI 29.68 kg/m   General:  alert, cooperative, and no distress  Incision almost totally healed over.  Small less than 5 mm opening with granulation tissue present.  No drainage is noted.  Silver nitrate was applied.     Assessment:    Doing well postoperatively. Has healed well by secondary intention.    Plan:   Patient is already resume normal activity.  Follow-up here as needed.

## 2021-09-14 ENCOUNTER — Encounter: Payer: BC Managed Care – PPO | Admitting: General Surgery

## 2021-10-13 DIAGNOSIS — Z6832 Body mass index (BMI) 32.0-32.9, adult: Secondary | ICD-10-CM | POA: Diagnosis not present

## 2021-10-13 DIAGNOSIS — E6609 Other obesity due to excess calories: Secondary | ICD-10-CM | POA: Diagnosis not present

## 2021-10-13 DIAGNOSIS — L980 Pyogenic granuloma: Secondary | ICD-10-CM | POA: Diagnosis not present

## 2022-01-06 DIAGNOSIS — J069 Acute upper respiratory infection, unspecified: Secondary | ICD-10-CM | POA: Diagnosis not present

## 2022-01-15 ENCOUNTER — Ambulatory Visit
Admission: EM | Admit: 2022-01-15 | Discharge: 2022-01-15 | Disposition: A | Discharge status: Home / Self Care | Payer: BC Managed Care – PPO | Attending: Nurse Practitioner | Admitting: Nurse Practitioner

## 2022-01-15 DIAGNOSIS — J209 Acute bronchitis, unspecified: Secondary | ICD-10-CM | POA: Diagnosis not present

## 2022-01-15 MED ORDER — ALBUTEROL SULFATE HFA 108 (90 BASE) MCG/ACT IN AERS: 2.0000 | INHALATION_SPRAY | Freq: Four times a day (QID) | RESPIRATORY_TRACT | 0 refills | Status: AC | PRN

## 2022-01-15 MED ORDER — PROMETHAZINE-DM 6.25-15 MG/5ML PO SYRP: 5.0000 mL | ORAL_SOLUTION | Freq: Four times a day (QID) | ORAL | 0 refills | Status: AC | PRN

## 2022-01-15 MED ORDER — PREDNISONE 20 MG PO TABS: 40.0000 mg | ORAL_TABLET | Freq: Every day | ORAL | 0 refills | Status: AC

## 2022-01-15 NOTE — ED Provider Notes (Signed)
[facility]    CSN: [id number] Arrival date & time: 01/15/22  7628      History   Chief Complaint Chief Complaint  Patient presents with   Cough         HPI Kristopher Delk is a 46 y.o. female.   The history is provided by the patient and the spouse.   The patient presents for complaints of cough is been present for the past week.  Patient states 1 to 2 days after her cough started, she did go to another urgent care and was prescribed amoxicillin.  States that she finished the amoxicillin 2 days ago.  She states that the cough has continued to persist.  She states cough is worse at night, but is also persist during the day.  She denies fever, chills, wheezing, shortness of breath, difficulty breathing, or GI symptoms.  She states that she has coughed so much, her back is now starting to hurt.  She states that the previous clinic did not prescribe any medications to help for her cough, only an antibiotic.  She also states that they did not test her for COVID or flu.  Past Medical History:  Diagnosis Date   Bone spur    Depression with anxiety    GERD (gastroesophageal reflux disease)    Migraines    Plantar fasciitis     Patient Active Problem List   Diagnosis Date Noted   Pilonidal cyst    Chronic gastritis without bleeding    Dyspepsia 02/25/2019   Fatty liver 02/25/2019    Past Surgical History:  Procedure Laterality Date   BIOPSY  03/04/2019   Procedure: BIOPSY;  Surgeon: Danie Binder, MD;  Location: [facility];  Service: Endoscopy;;  duodenal gastric   CHOLECYSTECTOMY     ESOPHAGOGASTRODUODENOSCOPY (EGD) WITH PROPOFOL N/A 03/04/2019   Procedure: ESOPHAGOGASTRODUODENOSCOPY (EGD) WITH PROPOFOL;  Surgeon: Danie Binder, MD;  Location: [facility];  Service: Endoscopy;  Laterality: N/A;  8:30am   INCISION AND DRAINAGE PERIRECTAL ABSCESS     in 20s   LEG SURGERY     bone shaved down on femur   PILONIDAL CYST EXCISION N/A 08/04/2021   Procedure:  CYST EXCISION PILONIDAL SIMPLE;  Surgeon: Aviva Signs, MD;  Location: [facility];  Service: General;  Laterality: N/A;   TUBAL LIGATION      OB History   No obstetric history on file.      Home Medications    Prior to Admission medications   Medication Sig Start Date End Date Taking? Authorizing Provider  albuterol (VENTOLIN HFA) 108 (90 Base) MCG/ACT inhaler Inhale 2 puffs into the lungs every 6 (six) hours as needed for wheezing or shortness of breath. 01/15/22  Yes Yeira Gulden-Warren, Alda Lea, NP  predniSONE (DELTASONE) 20 MG tablet Take 2 tablets (40 mg total) by mouth daily with breakfast for 5 days. 01/15/22 01/20/22 Yes Brailen Macneal-Warren, Alda Lea, NP  promethazine-dextromethorphan (PROMETHAZINE-DM) 6.25-15 MG/5ML syrup Take 5 mLs by mouth 4 (four) times daily as needed for up to 7 days for cough. 01/15/22 01/22/22 Yes Makenize Messman-Warren, Alda Lea, NP  ALPRAZolam Duanne Moron) 0.5 MG tablet Take 0.5 mg by mouth 3 (three) times daily as needed for anxiety.  01/22/19   [provider]  Biotin w/ Vitamins C & E (HAIR/SKIN/NAILS PO) Take 1 tablet by mouth daily.    [provider]  buPROPion (WELLBUTRIN XL) 300 MG 24 hr tablet Take 300 mg by mouth daily.    [provider]  COLLAGEN PO Take 1 capsule by mouth daily.    [provider]  pantoprazole (PROTONIX) 40 MG tablet Take 40 mg by mouth daily. 02/11/19   [provider]  rizatriptan (MAXALT-MLT) 10 MG disintegrating tablet Take 10 mg by mouth every 2 (two) hours as needed for migraine.  01/10/19   [provider]    Family History Family History  Problem Relation Age of Onset   Cirrhosis Mother    Colon cancer Neg Hx    Colon polyps Neg Hx     Social History Social History   Tobacco Use   Smoking status: Former    Packs/day: 1.00    Years: 15.00    Total pack years: 15.00    Types: Cigarettes    Quit date: 12/22/2019    Years since quitting: 2.0   Smokeless tobacco: Never  Substance  Use Topics   Alcohol use: Yes    Comment: occas   Drug use: Never     Allergies   Patient has no known allergies.   Review of Systems Review of Systems Per HPI  Physical Exam Triage Vital Signs ED Triage Vitals  Enc Vitals Group     BP 01/15/22 1045 (!) 167/101     Pulse Rate 01/15/22 1045 85     Resp 01/15/22 1045 19     Temp 01/15/22 1045 98.3 F (36.8 C)     Temp Source 01/15/22 1045 Oral     SpO2 01/15/22 1045 98 %     Weight --      Height --      Head Circumference --      Peak Flow --      Pain Score 01/15/22 1044 6     Pain Loc --      Pain Edu? --      Excl. in [place]? --    No data found.  Updated Vital Signs BP (!) 167/101 (BP Location: Right Arm)   Pulse 85   Temp 98.3 F (36.8 C) (Oral)   Resp 19   SpO2 98%   Visual Acuity Right Eye Distance:   Left Eye Distance:   Bilateral Distance:    Right Eye Near:   Left Eye Near:    Bilateral Near:     Physical Exam Vitals and nursing note reviewed.  Constitutional:      General: She is not in acute distress.    Appearance: Normal appearance.  HENT:     Head: Normocephalic.     Right Ear: Tympanic membrane, ear canal and external ear normal.     Left Ear: Tympanic membrane, ear canal and external ear normal.     Nose: Nose normal.     Mouth/Throat:     Mouth: Mucous membranes are moist.  Eyes:     Extraocular Movements: Extraocular movements intact.     Pupils: Pupils are equal, round, and reactive to light.  Cardiovascular:     Rate and Rhythm: Normal rate and regular rhythm.     Pulses: Normal pulses.     Heart sounds: Normal heart sounds.  Pulmonary:     Effort: Pulmonary effort is normal. No respiratory distress.     Breath sounds: Normal breath sounds. No stridor. No wheezing, rhonchi or rales.     Comments: Persistent cough noted during exam. Abdominal:     General: Bowel sounds are normal.     Palpations: Abdomen is soft.     Tenderness: There is no abdominal tenderness.  Musculoskeletal:     Cervical back: Normal range of motion.  Lymphadenopathy:     Cervical: No cervical adenopathy.  Skin:    General: Skin is warm and dry.  Neurological:     General: No focal deficit present.     Mental Status: She is alert and oriented to person, place, and time.  Psychiatric:        Mood and Affect: Mood normal.        Behavior: Behavior normal.      UC Treatments / Results  Labs (all labs ordered are listed, but only abnormal results are displayed) Labs Reviewed - No data to display  EKG   Radiology No results found.  Procedures Procedures (including critical care time)  Medications Ordered in UC Medications - No data to display  Initial Impression / Assessment and Plan / UC Course  I have reviewed the triage vital signs and the nursing notes.  Pertinent labs & imaging results that were available during my care of the patient were reviewed by me and considered in my medical decision making (see chart for details).  Patient presents for complaints of cough that is been present for the past week.  On exam, patient has persistent coughing, that is dry.  She has no wheezing, shortness of breath, or difficulty breathing.  Based on the duration of the cough and his persistence, symptoms are consistent with acute bronchitis.  Explained to patient that amoxicillin probably did not help because this is mostly of viral etiology.  Will start patient on prednisone 40 mg, Promethazine DM cough syrup for nighttime, and an albuterol inhaler for cough and shortness of breath.  Supportive care recommendations were provided to the patient along with strict ER precautions.  Patient verbalizes understanding.  All questions were answered.  Patient is stable for discharge. Final Clinical Impressions(s) / UC Diagnoses   Final diagnoses:  Acute bronchitis, unspecified organism     Discharge Instructions      Take medication as prescribed. Increase fluids and allow  for plenty of rest. Recommend using a humidifier and sleeping elevated on pillows while cough symptoms persist. Keep cough drops or throat lozenges with you while cough symptoms persist. Go to the emergency department if you develop shortness of breath, difficulty breathing, or other concerns. Please be advised the cough may consist for several weeks. If you continue to have the cough, but feel well continue supportive care.  Follow up with your PCP if symptoms do not improve.       ED Prescriptions     Medication Sig Dispense Auth. Provider   predniSONE (DELTASONE) 20 MG tablet Take 2 tablets (40 mg total) by mouth daily with breakfast for 5 days. 10 tablet Kadance Mccuistion-Warren, Alda Lea, NP   promethazine-dextromethorphan (PROMETHAZINE-DM) 6.25-15 MG/5ML syrup Take 5 mLs by mouth 4 (four) times daily as needed for up to 7 days for cough. 140 mL Gaberial Cada-Warren, Alda Lea, NP   albuterol (VENTOLIN HFA) 108 (90 Base) MCG/ACT inhaler Inhale 2 puffs into the lungs every 6 (six) hours as needed for wheezing or shortness of breath. 8 g [place] Want-Warren, Alda Lea, NP      PDMP not reviewed this encounter.   Tish Men, NP 01/15/22 1113

## 2022-01-15 NOTE — Discharge Instructions (Addendum)
Take medication as prescribed. Increase fluids and allow for plenty of rest. Recommend using a humidifier and sleeping elevated on pillows while cough symptoms persist. Keep cough drops or throat lozenges with you while cough symptoms persist. Go to the emergency department if you develop shortness of breath, difficulty breathing, or other concerns. Please be advised the cough may consist for several weeks. If you continue to have the cough, but feel well continue supportive care.  Follow up with your PCP if symptoms do not improve.

## 2022-01-15 NOTE — ED Triage Notes (Signed)
Pt reports cough and back pain x 1 week. Robitussin and Mucinex gives no relief. Pt finished amoxicillin 2 days ago.

## 2022-01-21 DIAGNOSIS — H10013 Acute follicular conjunctivitis, bilateral: Secondary | ICD-10-CM | POA: Diagnosis not present

## 2022-02-26 DIAGNOSIS — R030 Elevated blood-pressure reading, without diagnosis of hypertension: Secondary | ICD-10-CM | POA: Diagnosis not present

## 2022-02-26 DIAGNOSIS — U071 COVID-19: Secondary | ICD-10-CM | POA: Diagnosis not present

## 2022-02-26 DIAGNOSIS — E663 Overweight: Secondary | ICD-10-CM | POA: Diagnosis not present

## 2022-02-26 DIAGNOSIS — Z6829 Body mass index (BMI) 29.0-29.9, adult: Secondary | ICD-10-CM | POA: Diagnosis not present

## 2022-02-26 DIAGNOSIS — R059 Cough, unspecified: Secondary | ICD-10-CM | POA: Diagnosis not present

## 2022-03-14 DIAGNOSIS — R030 Elevated blood-pressure reading, without diagnosis of hypertension: Secondary | ICD-10-CM | POA: Diagnosis not present

## 2022-03-14 DIAGNOSIS — K760 Fatty (change of) liver, not elsewhere classified: Secondary | ICD-10-CM | POA: Diagnosis not present

## 2022-03-14 DIAGNOSIS — Z6832 Body mass index (BMI) 32.0-32.9, adult: Secondary | ICD-10-CM | POA: Diagnosis not present

## 2022-03-14 DIAGNOSIS — E6609 Other obesity due to excess calories: Secondary | ICD-10-CM | POA: Diagnosis not present

## 2022-05-10 DIAGNOSIS — Z6830 Body mass index (BMI) 30.0-30.9, adult: Secondary | ICD-10-CM | POA: Diagnosis not present

## 2022-05-10 DIAGNOSIS — M62830 Muscle spasm of back: Secondary | ICD-10-CM | POA: Diagnosis not present

## 2022-05-10 DIAGNOSIS — E669 Obesity, unspecified: Secondary | ICD-10-CM | POA: Diagnosis not present

## 2022-05-10 DIAGNOSIS — R030 Elevated blood-pressure reading, without diagnosis of hypertension: Secondary | ICD-10-CM | POA: Diagnosis not present

## 2022-08-04 DIAGNOSIS — Z6833 Body mass index (BMI) 33.0-33.9, adult: Secondary | ICD-10-CM | POA: Diagnosis not present

## 2022-08-04 DIAGNOSIS — M542 Cervicalgia: Secondary | ICD-10-CM | POA: Diagnosis not present

## 2022-08-04 DIAGNOSIS — E6609 Other obesity due to excess calories: Secondary | ICD-10-CM | POA: Diagnosis not present

## 2022-08-04 DIAGNOSIS — K911 Postgastric surgery syndromes: Secondary | ICD-10-CM | POA: Diagnosis not present

## 2022-09-05 DIAGNOSIS — M19041 Primary osteoarthritis, right hand: Secondary | ICD-10-CM | POA: Diagnosis not present

## 2022-09-05 DIAGNOSIS — Z6833 Body mass index (BMI) 33.0-33.9, adult: Secondary | ICD-10-CM | POA: Diagnosis not present

## 2022-09-05 DIAGNOSIS — E6609 Other obesity due to excess calories: Secondary | ICD-10-CM | POA: Diagnosis not present

## 2023-01-26 DIAGNOSIS — E6609 Other obesity due to excess calories: Secondary | ICD-10-CM | POA: Diagnosis not present

## 2023-01-26 DIAGNOSIS — Z6833 Body mass index (BMI) 33.0-33.9, adult: Secondary | ICD-10-CM | POA: Diagnosis not present

## 2023-01-26 DIAGNOSIS — M5432 Sciatica, left side: Secondary | ICD-10-CM | POA: Diagnosis not present

## 2023-02-28 DIAGNOSIS — M25552 Pain in left hip: Secondary | ICD-10-CM | POA: Diagnosis not present

## 2023-02-28 DIAGNOSIS — M545 Low back pain, unspecified: Secondary | ICD-10-CM | POA: Diagnosis not present

## 2023-05-21 DIAGNOSIS — J069 Acute upper respiratory infection, unspecified: Secondary | ICD-10-CM | POA: Diagnosis not present

## 2023-05-21 DIAGNOSIS — R509 Fever, unspecified: Secondary | ICD-10-CM | POA: Diagnosis not present

## 2023-05-21 DIAGNOSIS — R059 Cough, unspecified: Secondary | ICD-10-CM | POA: Diagnosis not present

## 2023-05-21 DIAGNOSIS — R030 Elevated blood-pressure reading, without diagnosis of hypertension: Secondary | ICD-10-CM | POA: Diagnosis not present

## 2023-07-04 DIAGNOSIS — N3000 Acute cystitis without hematuria: Secondary | ICD-10-CM | POA: Diagnosis not present

## 2023-07-11 DIAGNOSIS — E6609 Other obesity due to excess calories: Secondary | ICD-10-CM | POA: Diagnosis not present

## 2023-07-11 DIAGNOSIS — Z6833 Body mass index (BMI) 33.0-33.9, adult: Secondary | ICD-10-CM | POA: Diagnosis not present

## 2023-07-11 DIAGNOSIS — R81 Glycosuria: Secondary | ICD-10-CM | POA: Diagnosis not present

## 2023-07-11 DIAGNOSIS — N390 Urinary tract infection, site not specified: Secondary | ICD-10-CM | POA: Diagnosis not present

## 2023-08-03 DIAGNOSIS — R5383 Other fatigue: Secondary | ICD-10-CM | POA: Diagnosis not present

## 2023-08-03 DIAGNOSIS — Z6834 Body mass index (BMI) 34.0-34.9, adult: Secondary | ICD-10-CM | POA: Diagnosis not present

## 2023-08-03 DIAGNOSIS — E6609 Other obesity due to excess calories: Secondary | ICD-10-CM | POA: Diagnosis not present

## 2023-08-03 DIAGNOSIS — F419 Anxiety disorder, unspecified: Secondary | ICD-10-CM | POA: Diagnosis not present

## 2023-08-08 DIAGNOSIS — E6609 Other obesity due to excess calories: Secondary | ICD-10-CM | POA: Diagnosis not present

## 2023-08-08 DIAGNOSIS — R5383 Other fatigue: Secondary | ICD-10-CM | POA: Diagnosis not present

## 2023-08-08 DIAGNOSIS — K760 Fatty (change of) liver, not elsewhere classified: Secondary | ICD-10-CM | POA: Diagnosis not present

## 2023-09-27 DIAGNOSIS — N390 Urinary tract infection, site not specified: Secondary | ICD-10-CM | POA: Diagnosis not present

## 2023-10-31 DIAGNOSIS — R0981 Nasal congestion: Secondary | ICD-10-CM | POA: Diagnosis not present

## 2023-10-31 DIAGNOSIS — J069 Acute upper respiratory infection, unspecified: Secondary | ICD-10-CM | POA: Diagnosis not present

## 2024-02-20 NOTE — Progress Notes (Signed)
 @ (DOB 1975/07/04) female.   Patient has been advised as to the limitations and limited nature of physical exam due to nature of a video visit, the possibility of privacy risk in the use of a video visit, and that the healthcare provider may recommend visiting a healthcare clinic for in-person care and follow up. Date of birth verified. Place of service: patient home   Pt reports that he/she is physically located in the state of [place].  AI technology was used in creating this visit note. Verbal consent from the patient/caregiver was obtained prior to its use.     Assessment/Plan  1. Influenza (Primary)        - Tamiflu sent to pharmacy.  2. Acute cough -     benzonatate (TESSALON) 100 mg capsule; Take one capsule (100 mg dose) by mouth 3 (three) times a day as needed for Cough., Starting Wed 02/20/2024, Normal    Assessment & Plan 1.  - She is advised to take an over-the-counter COVID-19 and influenza test and send the results back. Influenza was positive.  - Azelastine nasal spray is recommended for postnasal drip and drainage. Tessalon Perles 200 mg every 8 hours has been prescribed. Additional recommendations include using a neti pot or saline rinse, running a humidifier, taking steamy showers, and monitoring for fever or wheezing. She should avoid taking any other over-the-counter medications containing cough suppressants while using Tessalon Perles.   Discussed course of illness.  Push fluids.  Saline and Mucinex for nasal congestion/cough.  Elevate head of bed and use a humidifier.  Advised Tylenol /Motrin as needed for pain or fever. Discussed signs and symptoms of respiratory distress and reasons for return. Discussed potential benefits and side effects of Tamiflu.   Monitor hydration status and for any signs or symptoms of secondary infection.  Follow up in-person if worsening or persistent symptoms.   Discussed differential diagnoses, red flag symptoms, when to seek emergency  care.  Patient verbalizes understanding. Patient advised that if symptoms worsen/intensify or fail to improvement they should seek immediate medical care as necessary.  Patient verbalizes understanding the aforementioned recommendations.   Call 911 or go to the ER for any shortness of breath, chest pain, or any other concerning symptoms.  Discussed differential diagnoses, red flag symptoms, when to seek emergency care.  Patient verbalizes understanding.   Clincial recommendations provided during the video interaction. Patient advised that if symptoms worsen/intensify or fail to improvement they should seek immediate medical care as necessary.  Patient verbalizes understanding the aforementioned recommendations.     Risks, benefits, and alternatives of the medications and treatment plan prescribed today were discussed, and patient expressed understanding.    No orders of the defined types were placed in this encounter.  Patient's Medications  New Prescriptions   BENZONATATE (TESSALON) 100 MG CAPSULE    Take one capsule (100 mg dose) by mouth 3 (three) times a day as needed for Cough.   OSELTAMIVIR PHOSPHATE (TAMIFLU) 75 MG CAPSULE    Take one capsule (75 mg dose) by mouth 2 (two) times daily for 5 days.  Previous Medications   ALPRAZOLAM (XANAX) 0.5 MG TABLET    Take one tablet (0.5 mg dose) by mouth daily as needed for Anxiety. Max Daily Amount: 0.5 mg   BUPROPION HCL (WELLBUTRIN XL) 300 MG 24 HR TABLET    Take one tablet (300 mg dose) by mouth daily.   LISINOPRIL (PRINIVIL,ZESTRIL) 10 MG TABLET    Take one tablet (10 mg dose) by mouth  daily.   PANTOPRAZOLE SODIUM (PROTONIX) 40 MG TABLET    Take one tablet (40 mg dose) by mouth 2 (two) times daily.  Modified Medications   No medications on file  Discontinued Medications   No medications on file     Clincial recommendations provided during the video interaction. Patient advised that if symptoms worsen/intensify or fail to improvement  they should seek immediate medical care as necessary.  Patient verbalizes understanding the aforementioned recommendations.  I have reviewed the information contained in this note and personally verified its accuracy.  MDM billing - I personally developed the plan of care based on documented medical decision making. Darice Dell, PA-C  Video Visit History    Subjective      Patient presents with   URI    C/O cough, body aches from cough, chest congestion, sore throat, headache x 3 days. Denies fever, n/v/d. OTC meds- cough suppressant, cough drops. Pt has not done a covid or flu test.      History of Present Illness The patient presents for evaluation of cough and congestion.  She reports experiencing frequent coughing episodes, which have led to lightheadedness. This morning, she had to halt her vehicle due to a coughing fit that induced a sensation of impending fainting. She describes a persistent tickling sensation in her throat and significant postnasal drip. She is not experiencing any fever, body aches, or influenza-like symptoms. However, she reports back pain, which she attributes to the coughing. Her cough was present yesterday but has intensified today. She has been self-medicating with a sinus pill for sinus drainage and over-the-counter cough medicine. An attempt to take Mucinex this morning resulted in immediate regurgitation due to phlegm-induced gagging. She reports no recent exposure to individuals with influenza or COVID-19 and has no history of asthma, wheezing, or pneumonia. She was previously prescribed azelastine nasal spray for an upper respiratory infection approximately a month ago but has not used it during this current episode. She does not require a refill of the nasal spray at this time. She has never taken [employer] before.     Reviewed and updated this visit by provider: Tobacco  Allergies  Meds  Problems  Med Hx  Surg Hx  Fam Hx        ROS: As  documented in the history above, all other relevant system complaints were negative.  Reviewed and updated this visit by provider: Tobacco  Allergies  Meds  Problems  Med Hx  SE Hx  Surg Hx  Fam Hx  on 02/21/2024 at 2:48 AM  Video Visit Objective Findings   Examination conducted with the use of video cameras/computer monitors. Vital signs and other aspects of physical exam are limited due to the nature of this encounter.    Ht 5' 8 (1.727 m)   Wt 209 lb (94.8 kg)   LMP  (LMP Unknown)   Breastfeeding No   BMI 31.78 kg/m     Physical Exam  Nursing note reviewed.   Constitutional:  No apparent acute distress noted during the video interaction; Alert and oriented with normal mentation and verbally interactive. HENT:     Nose: Nose normal.  Pulmonary:     Effort: Pulmonary effort is normal.   Computer technology was used to create visit note. Consent from the patient/care giver was obtained prior to its use.

## 2024-02-29 LAB — COLOGUARD: COLOGUARD: NEGATIVE

## 2024-03-05 ENCOUNTER — Encounter: Payer: Self-pay | Admitting: Gastroenterology

## 2024-04-16 ENCOUNTER — Ambulatory Visit: Admitting: Gastroenterology
